# Patient Record
Sex: Male | Born: 1935 | Race: White | Hispanic: No | Marital: Married | Smoking: Never smoker
Health system: Southern US, Community
[De-identification: ages and names within clinical notes are randomized; demographics above are authoritative.]

## PROBLEM LIST (undated history)

## (undated) DIAGNOSIS — Z85820 Personal history of malignant melanoma of skin: Secondary | ICD-10-CM

## (undated) DIAGNOSIS — N400 Enlarged prostate without lower urinary tract symptoms: Secondary | ICD-10-CM

## (undated) DIAGNOSIS — C679 Malignant neoplasm of bladder, unspecified: Secondary | ICD-10-CM

## (undated) DIAGNOSIS — D509 Iron deficiency anemia, unspecified: Secondary | ICD-10-CM

## (undated) DIAGNOSIS — Z9889 Other specified postprocedural states: Secondary | ICD-10-CM

## (undated) DIAGNOSIS — Z8719 Personal history of other diseases of the digestive system: Secondary | ICD-10-CM

## (undated) DIAGNOSIS — I10 Essential (primary) hypertension: Secondary | ICD-10-CM

## (undated) DIAGNOSIS — E039 Hypothyroidism, unspecified: Secondary | ICD-10-CM

## (undated) HISTORY — PX: INGUINAL HERNIA REPAIR: SUR1180

## (undated) HISTORY — PX: MELANOMA EXCISION: SHX5266

## (undated) HISTORY — PX: ESOPHAGOGASTRODUODENOSCOPY ENDOSCOPY: SHX5814

---

## 1999-01-31 ENCOUNTER — Encounter: Payer: Self-pay | Admitting: Gastroenterology

## 1999-01-31 ENCOUNTER — Ambulatory Visit (HOSPITAL_COMMUNITY): Admission: RE | Admit: 1999-01-31 | Discharge: 1999-01-31 | Payer: Self-pay | Admitting: Pediatrics

## 2000-09-20 ENCOUNTER — Encounter: Payer: Self-pay | Admitting: General Surgery

## 2000-09-24 ENCOUNTER — Observation Stay (HOSPITAL_COMMUNITY): Admission: RE | Admit: 2000-09-24 | Discharge: 2000-09-25 | Payer: Self-pay | Admitting: General Surgery

## 2003-09-30 ENCOUNTER — Ambulatory Visit (HOSPITAL_COMMUNITY): Admission: RE | Admit: 2003-09-30 | Discharge: 2003-09-30 | Payer: Self-pay | Admitting: Gastroenterology

## 2004-02-25 ENCOUNTER — Encounter: Admission: RE | Admit: 2004-02-25 | Discharge: 2004-02-25 | Payer: Self-pay | Admitting: Gastroenterology

## 2012-11-21 ENCOUNTER — Other Ambulatory Visit: Payer: Self-pay | Admitting: Urology

## 2012-11-26 ENCOUNTER — Encounter (HOSPITAL_COMMUNITY): Payer: Self-pay | Admitting: Pharmacy Technician

## 2012-11-28 ENCOUNTER — Other Ambulatory Visit (HOSPITAL_COMMUNITY): Payer: Self-pay | Admitting: Urology

## 2012-11-28 NOTE — Patient Instructions (Addendum)
20 Matthew Walters  11/28/2012   Your procedure is scheduled on: 12/04/12  Report to [facility] at [phone].  Call this number if you have problems the morning of surgery [phone]   Remember:   Do not eat food:After Midnight Tuesday night, may have clear liquids from midnight Tuesday night until 755am day of surgery, then nothing by              mouth   Take these medicines the morning of surgery with A SIP OF WATER: synthroid, amlodipine/norvasc                                SEE [facility] PREPARING FOR SURGERY SHEET   Do not wear jewelry, make-up or nail polish.  Do not wear lotions, powders, or perfumes. You may wear deodorant.   Men may shave face and neck.  Do not bring valuables to the hospital.  Contacts, dentures or bridgework may not be worn into surgery.  Leave suitcase in the car. After surgery it may be brought to your room.  For patients admitted to the hospital, checkout time is 11:00 AM the day of discharge.    Please read over the following fact sheets that you were given: MRSA Information, clear liquids fact sheet  Call  Birdie Sons RN pre op nurse if needed [phone]    FAILURE TO FOLLOW THESE INSTRUCTIONS MAY RESULT IN THE CANCELLATION OF YOUR SURGERY. PATIENT SIGNATURE___________________________________________

## 2012-11-29 ENCOUNTER — Encounter (HOSPITAL_COMMUNITY)
Admission: RE | Admit: 2012-11-29 | Discharge: 2012-11-29 | Disposition: A | Discharge status: Home / Self Care | Payer: Medicare Other | Source: Ambulatory Visit | Attending: Urology | Admitting: Urology

## 2012-11-29 ENCOUNTER — Encounter (HOSPITAL_COMMUNITY): Payer: Self-pay

## 2012-11-29 ENCOUNTER — Ambulatory Visit (HOSPITAL_COMMUNITY)
Admission: RE | Admit: 2012-11-29 | Discharge: 2012-11-29 | Disposition: A | Discharge status: Home / Self Care | Payer: Medicare Other | Source: Ambulatory Visit | Attending: Urology | Admitting: Urology

## 2012-11-29 DIAGNOSIS — I10 Essential (primary) hypertension: Secondary | ICD-10-CM | POA: Insufficient documentation

## 2012-11-29 DIAGNOSIS — D494 Neoplasm of unspecified behavior of bladder: Secondary | ICD-10-CM | POA: Insufficient documentation

## 2012-11-29 DIAGNOSIS — K449 Diaphragmatic hernia without obstruction or gangrene: Secondary | ICD-10-CM | POA: Insufficient documentation

## 2012-11-29 DIAGNOSIS — M479 Spondylosis, unspecified: Secondary | ICD-10-CM | POA: Insufficient documentation

## 2012-11-29 DIAGNOSIS — Z01810 Encounter for preprocedural cardiovascular examination: Secondary | ICD-10-CM | POA: Insufficient documentation

## 2012-11-29 DIAGNOSIS — Z01818 Encounter for other preprocedural examination: Secondary | ICD-10-CM | POA: Insufficient documentation

## 2012-11-29 DIAGNOSIS — I517 Cardiomegaly: Secondary | ICD-10-CM | POA: Insufficient documentation

## 2012-11-29 DIAGNOSIS — Z79899 Other long term (current) drug therapy: Secondary | ICD-10-CM | POA: Insufficient documentation

## 2012-11-29 DIAGNOSIS — Z01812 Encounter for preprocedural laboratory examination: Secondary | ICD-10-CM | POA: Insufficient documentation

## 2012-11-29 HISTORY — DX: Benign prostatic hyperplasia without lower urinary tract symptoms: N40.0

## 2012-11-29 HISTORY — DX: Essential (primary) hypertension: I10

## 2012-11-29 HISTORY — DX: Hypothyroidism, unspecified: E03.9

## 2012-11-29 LAB — CBC
MCH: 29.7 pg (ref 26.0–34.0)
MCHC: 34.2 g/dL (ref 30.0–36.0)
Platelets: 212 10*3/uL (ref 150–400)
RBC: 4.71 MIL/uL (ref 4.22–5.81)
RDW: 12.7 % (ref 11.5–15.5)

## 2012-11-29 LAB — BASIC METABOLIC PANEL
BUN: 12 mg/dL (ref 6–23)
Calcium: 9.7 mg/dL (ref 8.4–10.5)
GFR calc non Af Amer: 80 mL/min — ABNORMAL LOW (ref >90)
Glucose, Bld: 94 mg/dL (ref 70–99)
Sodium: 140 mEq/L (ref 135–145)

## 2012-11-29 LAB — SURGICAL PCR SCREEN: MRSA, PCR: NEGATIVE

## 2012-12-03 MED ORDER — GENTAMICIN SULFATE 40 MG/ML IJ SOLN
320.0000 mg | INTRAVENOUS | Status: AC
  Administered 2012-12-04: 320 mg via INTRAVENOUS
  Filled 2012-12-03: qty 8

## 2012-12-04 ENCOUNTER — Ambulatory Visit (HOSPITAL_COMMUNITY): Payer: Medicare Other | Admitting: Anesthesiology

## 2012-12-04 ENCOUNTER — Encounter (HOSPITAL_COMMUNITY): Payer: Self-pay | Admitting: *Deleted

## 2012-12-04 ENCOUNTER — Ambulatory Visit (HOSPITAL_COMMUNITY)
Admission: RE | Admit: 2012-12-04 | Discharge: 2012-12-05 | Disposition: A | Discharge status: Home / Self Care | Payer: Medicare Other | Source: Ambulatory Visit | Attending: Urology | Admitting: Urology

## 2012-12-04 ENCOUNTER — Encounter (HOSPITAL_COMMUNITY)
Admission: RE | Disposition: A | Discharge status: Home / Self Care | Payer: Self-pay | Source: Ambulatory Visit | Attending: Urology

## 2012-12-04 ENCOUNTER — Encounter (HOSPITAL_COMMUNITY): Payer: Self-pay | Admitting: Anesthesiology

## 2012-12-04 DIAGNOSIS — N138 Other obstructive and reflux uropathy: Secondary | ICD-10-CM | POA: Insufficient documentation

## 2012-12-04 DIAGNOSIS — R39198 Other difficulties with micturition: Secondary | ICD-10-CM | POA: Insufficient documentation

## 2012-12-04 DIAGNOSIS — N401 Enlarged prostate with lower urinary tract symptoms: Secondary | ICD-10-CM | POA: Insufficient documentation

## 2012-12-04 DIAGNOSIS — I10 Essential (primary) hypertension: Secondary | ICD-10-CM | POA: Insufficient documentation

## 2012-12-04 DIAGNOSIS — N3289 Other specified disorders of bladder: Secondary | ICD-10-CM | POA: Insufficient documentation

## 2012-12-04 DIAGNOSIS — K219 Gastro-esophageal reflux disease without esophagitis: Secondary | ICD-10-CM | POA: Insufficient documentation

## 2012-12-04 DIAGNOSIS — C679 Malignant neoplasm of bladder, unspecified: Secondary | ICD-10-CM | POA: Insufficient documentation

## 2012-12-04 DIAGNOSIS — E039 Hypothyroidism, unspecified: Secondary | ICD-10-CM | POA: Insufficient documentation

## 2012-12-04 DIAGNOSIS — Z85820 Personal history of malignant melanoma of skin: Secondary | ICD-10-CM | POA: Insufficient documentation

## 2012-12-04 HISTORY — PX: CYSTOSCOPY W/ RETROGRADES: SHX1426

## 2012-12-04 HISTORY — PX: TRANSURETHRAL RESECTION OF BLADDER TUMOR: SHX2575

## 2012-12-04 HISTORY — PX: TRANSURETHRAL RESECTION OF PROSTATE: SHX73

## 2012-12-04 LAB — HEMOGLOBIN AND HEMATOCRIT, BLOOD: Hemoglobin: 12.6 g/dL — ABNORMAL LOW (ref 13.0–17.0)

## 2012-12-04 SURGERY — TURBT (TRANSURETHRAL RESECTION OF BLADDER TUMOR)
Anesthesia: General

## 2012-12-04 MED ORDER — SUCCINYLCHOLINE CHLORIDE 20 MG/ML IJ SOLN
INTRAMUSCULAR | Status: DC | PRN
  Administered 2012-12-04: 100 mg via INTRAVENOUS

## 2012-12-04 MED ORDER — AMLODIPINE BESYLATE 5 MG PO TABS
5.0000 mg | ORAL_TABLET | Freq: Every day | ORAL | Status: DC
  Administered 2012-12-05: 5 mg via ORAL
  Filled 2012-12-04 (×2): qty 1

## 2012-12-04 MED ORDER — HYDROMORPHONE HCL PF 1 MG/ML IJ SOLN
0.2500 mg | INTRAMUSCULAR | Status: DC | PRN
  Administered 2012-12-04: 0.25 mg via INTRAVENOUS

## 2012-12-04 MED ORDER — LACTATED RINGERS IV SOLN
INTRAVENOUS | Status: DC | PRN
  Administered 2012-12-04: 13:00:00 via INTRAVENOUS

## 2012-12-04 MED ORDER — EPHEDRINE SULFATE 50 MG/ML IJ SOLN
INTRAMUSCULAR | Status: DC | PRN
  Administered 2012-12-04 (×2): 10 mg via INTRAVENOUS

## 2012-12-04 MED ORDER — MIDAZOLAM HCL 5 MG/5ML IJ SOLN
INTRAMUSCULAR | Status: DC | PRN
  Administered 2012-12-04: 0.5 mg via INTRAVENOUS

## 2012-12-04 MED ORDER — IOHEXOL 300 MG/ML  SOLN
INTRAMUSCULAR | Status: DC | PRN
  Administered 2012-12-04: 13 mL via URETHRAL

## 2012-12-04 MED ORDER — SENNA 8.6 MG PO TABS
1.0000 | ORAL_TABLET | Freq: Two times a day (BID) | ORAL | Status: DC
Start: 2012-12-04 — End: 2012-12-05
  Administered 2012-12-04: 8.6 mg via ORAL
  Filled 2012-12-04: qty 1

## 2012-12-04 MED ORDER — KCL IN DEXTROSE-NACL 20-5-0.45 MEQ/L-%-% IV SOLN
INTRAVENOUS | Status: DC
  Administered 2012-12-04: via INTRAVENOUS
  Filled 2012-12-04 (×2): qty 1000

## 2012-12-04 MED ORDER — ACETAMINOPHEN 10 MG/ML IV SOLN
INTRAVENOUS | Status: AC
  Filled 2012-12-04: qty 100

## 2012-12-04 MED ORDER — FENTANYL CITRATE 0.05 MG/ML IJ SOLN
INTRAMUSCULAR | Status: DC | PRN
  Administered 2012-12-04 (×6): 25 ug via INTRAVENOUS
  Administered 2012-12-04: 50 ug via INTRAVENOUS
  Administered 2012-12-04: 25 ug via INTRAVENOUS

## 2012-12-04 MED ORDER — ONDANSETRON HCL 4 MG/2ML IJ SOLN
INTRAMUSCULAR | Status: DC | PRN
  Administered 2012-12-04 (×2): 2 mg via INTRAVENOUS

## 2012-12-04 MED ORDER — SODIUM CHLORIDE 0.9 % IR SOLN
Status: DC | PRN
  Administered 2012-12-04: 21000 mL

## 2012-12-04 MED ORDER — NEOSTIGMINE METHYLSULFATE 1 MG/ML IJ SOLN
INTRAMUSCULAR | Status: DC | PRN
  Administered 2012-12-04: 3 mg via INTRAVENOUS

## 2012-12-04 MED ORDER — FINASTERIDE 5 MG PO TABS
5.0000 mg | ORAL_TABLET | Freq: Every day | ORAL | Status: DC
  Filled 2012-12-04 (×2): qty 1

## 2012-12-04 MED ORDER — GLYCOPYRROLATE 0.2 MG/ML IJ SOLN
INTRAMUSCULAR | Status: DC | PRN
  Administered 2012-12-04: 0.4 mg via INTRAVENOUS
  Administered 2012-12-04: 0.2 mg via INTRAVENOUS

## 2012-12-04 MED ORDER — ONDANSETRON HCL 4 MG/2ML IJ SOLN: 4.0000 mg | INTRAMUSCULAR | Status: DC | PRN

## 2012-12-04 MED ORDER — LACTATED RINGERS IV SOLN
INTRAVENOUS | Status: DC
  Administered 2012-12-04: 1000 mL via INTRAVENOUS

## 2012-12-04 MED ORDER — OXYCODONE HCL 5 MG PO TABS: 5.0000 mg | ORAL_TABLET | ORAL | Status: DC | PRN

## 2012-12-04 MED ORDER — LIDOCAINE HCL (CARDIAC) 20 MG/ML IV SOLN
INTRAVENOUS | Status: DC | PRN
  Administered 2012-12-04: 30 mg via INTRAVENOUS

## 2012-12-04 MED ORDER — PROPOFOL 10 MG/ML IV BOLUS
INTRAVENOUS | Status: DC | PRN
  Administered 2012-12-04 (×2): 30 mg via INTRAVENOUS
  Administered 2012-12-04: 140 mg via INTRAVENOUS

## 2012-12-04 MED ORDER — CISATRACURIUM BESYLATE (PF) 10 MG/5ML IV SOLN
INTRAVENOUS | Status: DC | PRN
  Administered 2012-12-04: 2 mg via INTRAVENOUS
  Administered 2012-12-04 (×2): 1 mg via INTRAVENOUS
  Administered 2012-12-04: 5 mg via INTRAVENOUS
  Administered 2012-12-04: 1 mg via INTRAVENOUS

## 2012-12-04 MED ORDER — OXYBUTYNIN CHLORIDE 5 MG PO TABS
5.0000 mg | ORAL_TABLET | Freq: Three times a day (TID) | ORAL | Status: DC | PRN
  Administered 2012-12-04 – 2012-12-05 (×2): 5 mg via ORAL
  Filled 2012-12-04 (×2): qty 1

## 2012-12-04 MED ORDER — LACTATED RINGERS IV SOLN: INTRAVENOUS | Status: DC

## 2012-12-04 MED ORDER — PROMETHAZINE HCL 25 MG/ML IJ SOLN: 6.2500 mg | INTRAMUSCULAR | Status: DC | PRN

## 2012-12-04 MED ORDER — DOCUSATE SODIUM 100 MG PO CAPS
100.0000 mg | ORAL_CAPSULE | Freq: Two times a day (BID) | ORAL | Status: DC
  Administered 2012-12-04: 100 mg via ORAL
  Filled 2012-12-04 (×3): qty 1

## 2012-12-04 MED ORDER — IOHEXOL 300 MG/ML  SOLN
INTRAMUSCULAR | Status: AC
  Filled 2012-12-04: qty 1

## 2012-12-04 MED ORDER — HYDROMORPHONE HCL PF 1 MG/ML IJ SOLN
0.5000 mg | INTRAMUSCULAR | Status: DC | PRN
  Administered 2012-12-04: 1 mg via INTRAVENOUS
  Filled 2012-12-04: qty 1

## 2012-12-04 MED ORDER — ACETAMINOPHEN 10 MG/ML IV SOLN
1000.0000 mg | Freq: Four times a day (QID) | INTRAVENOUS | Status: DC
  Administered 2012-12-04 – 2012-12-05 (×3): 1000 mg via INTRAVENOUS
  Filled 2012-12-04 (×3): qty 100

## 2012-12-04 MED ORDER — LEVOTHYROXINE SODIUM 25 MCG PO TABS
25.0000 ug | ORAL_TABLET | Freq: Every day | ORAL | Status: DC
  Administered 2012-12-05: 25 ug via ORAL
  Filled 2012-12-04 (×2): qty 1

## 2012-12-04 MED ORDER — KCL IN DEXTROSE-NACL 20-5-0.45 MEQ/L-%-% IV SOLN
INTRAVENOUS | Status: AC
  Filled 2012-12-04: qty 1000

## 2012-12-04 MED ORDER — HYDROMORPHONE HCL PF 1 MG/ML IJ SOLN
INTRAMUSCULAR | Status: AC
  Filled 2012-12-04: qty 1

## 2012-12-04 MED ORDER — SODIUM CHLORIDE 0.9 % IR SOLN
Status: DC | PRN
  Administered 2012-12-04: 1000 mL

## 2012-12-04 SURGICAL SUPPLY — 35 items
ADAPTER CATH URET PLST 4-6FR (CATHETERS) ×2 IMPLANT
ADPR CATH URET STRL DISP 4-6FR (CATHETERS)
BAG URINE DRAINAGE (UROLOGICAL SUPPLIES) ×3 IMPLANT
BAG URO CATCHER STRL LF (DRAPE) ×3 IMPLANT
CATH FOLEY 3WAY 30CC 22FR (CATHETERS) ×3 IMPLANT
CATH INTERMIT  6FR 70CM (CATHETERS) ×1 IMPLANT
CATH URET 5FR 28IN CONE TIP (BALLOONS)
CATH URET 5FR 70CM CONE TIP (BALLOONS) IMPLANT
CLOTH BEACON ORANGE TIMEOUT ST (SAFETY) ×3 IMPLANT
DRAPE CAMERA CLOSED 9X96 (DRAPES) ×3 IMPLANT
ELECT BUTTON HF 24-28F 2 30DE (ELECTRODE) ×2 IMPLANT
ELECT LOOP MED HF 24F 12D (CUTTING LOOP) ×2 IMPLANT
ELECT LOOP MED HF 24F 12D CBL (CLIP) ×3 IMPLANT
ELECT REM PT RETURN 9FT ADLT (ELECTROSURGICAL)
ELECT RESECT VAPORIZE 12D CBL (ELECTRODE) ×2 IMPLANT
ELECTRODE REM PT RTRN 9FT ADLT (ELECTROSURGICAL) ×2 IMPLANT
EVACUATOR MICROVAS BLADDER (UROLOGICAL SUPPLIES) IMPLANT
GLOVE BIOGEL M STRL SZ7.5 (GLOVE) ×8 IMPLANT
GOWN STRL NON-REIN LRG LVL3 (GOWN DISPOSABLE) ×7 IMPLANT
GOWN STRL REIN XL XLG (GOWN DISPOSABLE) ×3 IMPLANT
GUIDEWIRE STR DUAL SENSOR (WIRE) ×3 IMPLANT
HOLDER FOLEY CATH W/STRAP (MISCELLANEOUS) IMPLANT
KIT ASPIRATION TUBING (SET/KITS/TRAYS/PACK) IMPLANT
LASER FIBER DISP (UROLOGICAL SUPPLIES) IMPLANT
LOOPS RESECTOSCOPE DISP (ELECTROSURGICAL) ×2 IMPLANT
MANIFOLD NEPTUNE II (INSTRUMENTS) ×3 IMPLANT
NS IRRIG 1000ML POUR BTL (IV SOLUTION) ×1 IMPLANT
PACK CYSTO (CUSTOM PROCEDURE TRAY) ×3 IMPLANT
PLUG CATH AND CAP STER (CATHETERS) ×2 IMPLANT
STENT CONTOUR 6FRX26X.038 (STENTS) ×1 IMPLANT
SUT ETHILON 3 0 PS 1 (SUTURE) IMPLANT
SYR 30ML LL (SYRINGE) IMPLANT
SYRINGE IRR TOOMEY STRL 70CC (SYRINGE) ×3 IMPLANT
TUBING CONNECTING 10 (TUBING) ×3 IMPLANT
WIRE COONS/BENSON .038X145CM (WIRE) ×2 IMPLANT

## 2012-12-04 NOTE — Brief Op Note (Signed)
12/04/2012  3:56 PM  PATIENT:  Matthew Walters  79 y.o. male  PRE-OPERATIVE DIAGNOSIS:  BLADDER CANCER, BENIGN PROSTATIC HYPERTROPHY  POST-OPERATIVE DIAGNOSIS:  BLADDER CANCER, BENIGN PROSTATIC HYPERTROPHY  PROCEDURE:  Procedure(s): TRANSURETHRAL RESECTION OF BLADDER TUMOR (TURBT) (N/A) TRANSURETHRAL RESECTION OF THE PROSTATE WITH GYRUS INSTRUMENTS (N/A) CYSTOSCOPY WITH RETROGRADE PYELOGRAM, right stent (Bilateral)  SURGEON:  Surgeon(s) and Role:    * Sebastian Ache, MD - Primary  PHYSICIAN ASSISTANT:   ASSISTANTS: none   ANESTHESIA:   none  EBL:  Total I/O In: -  Out: 200 [Blood:200]  BLOOD ADMINISTERED:none  DRAINS: 86F 3-way catheter to NS irrigation   LOCAL MEDICATIONS USED:  NONE  SPECIMEN:  Source of Specimen:  1 - bladder tumor, 2 - base of bladder tumor, 3 - prostate chips   DISPOSITION OF SPECIMEN:  PATHOLOGY  COUNTS:  YES  TOURNIQUET:  * No tourniquets in log *  DICTATION: .Other Dictation: Dictation Number [id number]  PLAN OF CARE: Admit for overnight observation  PATIENT DISPOSITION:  PACU - hemodynamically stable.   Delay start of Pharmacological VTE agent (>24hrs) due to surgical blood loss or risk of bleeding: yes

## 2012-12-04 NOTE — Anesthesia Preprocedure Evaluation (Addendum)
Anesthesia Evaluation  Patient identified by MRN, date of birth, ID band Patient awake    Reviewed: Allergy & Precautions, H&P , NPO status , Patient's Chart, lab work & pertinent test results  Airway Mallampati: II TM Distance: >3 FB Neck ROM: Full    Dental  (+) Poor Dentition, Partial Lower and Caps   Pulmonary neg pulmonary ROS,  breath sounds clear to auscultation  Pulmonary exam normal       Cardiovascular hypertension, Pt. on medications Rhythm:Regular Rate:Normal     Neuro/Psych negative neurological ROS  negative psych ROS   GI/Hepatic Neg liver ROS, GERD-  Medicated,  Endo/Other  Hypothyroidism   Renal/GU negative Renal ROS  negative genitourinary   Musculoskeletal negative musculoskeletal ROS (+)   Abdominal   Peds  Hematology negative hematology ROS (+)   Anesthesia Other Findings   Reproductive/Obstetrics                          Anesthesia Physical Anesthesia Plan  ASA: II  Anesthesia Plan: General   Post-op Pain Management:    Induction: Intravenous  Airway Management Planned: LMA  Additional Equipment:   Intra-op Plan:   Post-operative Plan: Extubation in OR  Informed Consent: I have reviewed the patients History and Physical, chart, labs and discussed the procedure including the risks, benefits and alternatives for the proposed anesthesia with the patient or authorized representative who has indicated his/her understanding and acceptance.   Dental advisory given  Plan Discussed with: CRNA  Anesthesia Plan Comments:         Anesthesia Quick Evaluation

## 2012-12-04 NOTE — Anesthesia Postprocedure Evaluation (Signed)
Anesthesia Post Note  Patient: Matthew Walters  Procedure(s) Performed: Procedure(s) (LRB): TRANSURETHRAL RESECTION OF BLADDER TUMOR (TURBT) (N/A) TRANSURETHRAL RESECTION OF THE PROSTATE WITH GYRUS INSTRUMENTS (N/A) CYSTOSCOPY WITH RETROGRADE PYELOGRAM, right stent (Bilateral)  Anesthesia type: General  Patient location: PACU  Post pain: Pain level controlled  Post assessment: Post-op Vital signs reviewed  Last Vitals:  Filed Vitals:   12/04/12 1738  BP: 132/72  Pulse: 62  Temp: 36.1 C  Resp: 14    Post vital signs: Reviewed  Level of consciousness: sedated  Complications: No apparent anesthesia complications

## 2012-12-04 NOTE — H&P (Signed)
Matthew Walters is an 79 y.o. male.    Chief Complaint: Pre-OP Transurethral Resection of Bladder Tumor and Prostate  HPI:   1 - Lower Urinary Tract Symptoms / Incomplete Emptying- Pt with progressive weak stream, double voiding x several years. No prior frank retention. No h/o Gu trauma or neurologic lesion.  PSA 2013 3 per report. No prior therapy. PVR 05/2012 " ". DRE with 50gm prostate and started on flomax + finasteride.  Pt then with improvemnt in symptoms with much less irritative and obstructive bother. Recent cysto however confirms trilobar hypertrophy with HUGE median lobe which makes access to his area of bladder tumor problematic.  2 - Bladder Cancer - Pt with gross blood and passage of some small clots 10/2012 promptig dedicated hematutria eval. CT Urogram 11/2012 with Rt sided polyploid mass near trigone and trilobar prostatic hypertrophy. Cysto 11/2012  confirms golf-ball sized mass near Rt UO and very large prostatic median lobe.  PMH sig for HTN, bilateral hernia repair.  Today [place] is seen ito proceed with TURBT and channel TURP. Most recent UA without infectious parameters.  Past Medical History  Diagnosis Date  . Hypertension   . Hypothyroidism   . GERD (gastroesophageal reflux disease)     hx of  . Anemia   . BPH (benign prostatic hypertrophy)   . Cancer     melanoma, bladder    Past Surgical History  Procedure Laterality Date  . Esophagogastroduodenoscopy endoscopy      "throat stretched"  . Hernia repair      x2  . Melanoma removal      x2    No family history on file. Social History:  reports that he has never smoked. He has never used smokeless tobacco. He reports that he does not drink alcohol or use illicit drugs.  Allergies:  Allergies  Allergen Reactions  . Sulfa Antibiotics Other (See Comments)    unknown    No prescriptions prior to admission    No results found for this or any previous visit (from the past 48 hour(s)). No results  found.  Review of Systems  Constitutional: Negative for fever, chills, weight loss and malaise/fatigue.  HENT: Negative.   Eyes: Negative.   Respiratory: Negative.   Cardiovascular: Negative.   Gastrointestinal: Negative.   Genitourinary: Positive for urgency, frequency and hematuria.  Musculoskeletal: Negative.   Skin: Negative.   Neurological: Negative.  Negative for weakness.  Endo/Heme/Allergies: Negative.   Psychiatric/Behavioral: Negative.     There were no vitals taken for this visit. Physical Exam  Constitutional: He is oriented to person, place, and time. He appears well-developed and well-nourished.  Very vigorous for age  HENT:  Head: Normocephalic and atraumatic.  Eyes: EOM are normal. Pupils are equal, round, and reactive to light.  Neck: Normal range of motion. Neck supple.  Cardiovascular: Normal rate and regular rhythm.   Respiratory: Effort normal and breath sounds normal.  GI: Soft. Bowel sounds are normal.  Genitourinary: Penis normal.  Musculoskeletal: Normal range of motion.  Neurological: He is alert and oriented to person, place, and time.  Skin: Skin is warm and dry.  Psychiatric: He has a normal mood and affect. His behavior is normal. Judgment and thought content normal.     Assessment/Plan  1 - Lower Urinary Tract Symptoms - Excellent subjective response to alpha blocker + 5ARI. Continue. Expalined that recent  PVR still elevated somewhat, but not dangerous level. Also explained that he will likely require "channel TURP" with resetion of  median lobe as a part of TURBT to allow better access to bladder tumor, and that this may help his LUTS somewhat.  2 - Bladder Cancer -  We re-discussed operative biopsy / transurethral resection as the best next step for diagnostic and therapeutic purposes with goals being to remove all visible cancer and obtain tissue for pathologic exam. We re-discussed that for some low-grade tumors, this may be all the treatment  required, but that for many other tumors such as high-grade lesions, further therapy including surgery and or chemotherapy may be warranted. We also outlined the fact that any bladder cancer diagnosis will require close follow-up with periodic upper and lower tract evaluation. We re-discussed risks including bleeding, infection, damage to kidney / ureter / bladder including bladder perforation which can typically managed with prolonged foley catheterization. We mentioned anesthetic and other rare risks including DVT, PE, MI, and mortality. I also mentioned that adjunctive procedures such as ureteral stenting, retrograde pyelography, and ureteroscopy may be necessary to fully evaluate the urinary tract depending on intra-operative findings. After answering all questions to the patient's satisfaction, they wish to proceed. Also re-dicussed concomitant channel TURP will be performed if unable to resect tumor adequeatly without, If TURP done, he will spend night in hospital for observation and require trial of void 3-5 days later.  The pt voiced understanding and wishes to proceed.  Temiloluwa Recchia 12/04/2012, 7:21 AM

## 2012-12-04 NOTE — Transfer of Care (Signed)
Immediate Anesthesia Transfer of Care Note  Patient: Matthew Walters  Procedure(s) Performed: Procedure(s): TRANSURETHRAL RESECTION OF BLADDER TUMOR (TURBT) (N/A) TRANSURETHRAL RESECTION OF THE PROSTATE WITH GYRUS INSTRUMENTS (N/A) CYSTOSCOPY WITH RETROGRADE PYELOGRAM, right stent (Bilateral)  Patient Location: PACU  Anesthesia Type:General  Level of Consciousness: sedated  Airway & Oxygen Therapy: Patient Spontanous Breathing and Patient connected to face mask oxygen  Post-op Assessment: Report given to PACU RN and Post -op Vital signs reviewed and stable  Post vital signs: stable  Complications: No apparent anesthesia complications

## 2012-12-05 ENCOUNTER — Encounter (HOSPITAL_COMMUNITY): Payer: Self-pay | Admitting: Urology

## 2012-12-05 MED ORDER — SENNA-DOCUSATE SODIUM 8.6-50 MG PO TABS: 1.0000 | ORAL_TABLET | Freq: Two times a day (BID) | ORAL | Status: DC

## 2012-12-05 MED ORDER — OXYCODONE-ACETAMINOPHEN 5-325 MG PO TABS: 1.0000 | ORAL_TABLET | ORAL | Status: DC | PRN

## 2012-12-05 MED ORDER — CEPHALEXIN 250 MG PO CAPS: 250.0000 mg | ORAL_CAPSULE | Freq: Two times a day (BID) | ORAL | Status: DC

## 2012-12-05 NOTE — Op Note (Signed)
Matthew Walters, Matthew Walters                 ACCOUNT NO.:  [id number]  MEDICAL RECORD NO.:  [id number]  LOCATION:  1434                         FACILITY:  [facility]  PHYSICIAN:  Sebastian Ache, MD     DATE OF BIRTH:  October 15, 1928  DATE OF PROCEDURE: 12/04/2012 DATE OF DISCHARGE:                              OPERATIVE REPORT   DIAGNOSES: 1. Prostatic hypertrophy. 2. Bladder cancer.  PROCEDURE: 1. Transurethral resection of bladder tumor volume large. 2. Transurethral resection of the prostate. 3. Bilateral retrograde pyelogram,  With interpretation. 4. Insertion of right ureteral stent 6 x 26, no tether.  ESTIMATED BLOOD LOSS:  50 mL.  SPECIMEN: 1. Bladder tumor. 2. Base of bladder tumor. 3. Prostate chips.  COMPLICATIONS:  None.  FINDINGS: 1. Very large prostatic median lobe.  Mild lateral lobe hypertrophy. 2. Large approximately 7 cm bladder tumor arising just lateral to the     right ureteral orifice.  INDICATION:  Matthew Walters is a pleasant 79 year old gentleman with history of obstructive voiding symptoms as well as recent gross hematuria.  This was a medical therapy for his voiding symptoms and had a good response, however, further workup of his hematuria revealed a very large bladder tumor just lateral to his right ureteral orifice.  Office cystoscopy which corroborated the finding and also a very large median lobe of his prostate.  It is clearly felt transurethral bladder tumor was warranted but due to the patient's very large median lobe, transurethral resection of the prostate would also be required concomitantly to allow access to the area of tumor and help with his voiding symptoms. Informed consent was obtained and placed in medical record.  PROCEDURE IN DETAIL:  The patient being Matthew Walters, was verified. Procedure being transurethral resection of bladder tumor, transurethral resection of prostate was confirmed.  Procedure was carried out.  Time- out was performed.   Intravenous antibiotics administered.  General endotracheal anesthesia was introduced.  The patient was placed into a low lithotomy position.  Sterile field was created by prepping the patient's penis, perineum, and proximal thighs using iodine x3.  Next, cystourethroscopy was performed using 22-French rigid cystoscope with 12- degree offset lens.  Inspection of anterior and posterior urethra revealed mild bilobar prostatic hypertrophy and very large median lobe hypertrophy.  Inspection of bladder revealed mild trabeculation.  There was a very large papillary bladder tumor arising from just lateral to the right ureteral orifice.  Attention was directed to the retrograde pyelograms first on the left side.  The left ureteral orifice was cannulated with a 6-French end-hole catheter and left retrograde pyelogram was seen.  Left retrograde pyelogram demonstrated a single left ureter, single system left kidney.  No filling defects or narrowing were noted. Similarly right retrograde pyelogram was obtained.  Right retrograde pyelogram demonstrated a single right ureter, single system right kidney. No filling defects or narrowing noted.  There is tortuosity demonstrated and it was felt that given the location of the tumor next to the ureteral orifice that stenting would be warranted postoperatively; however, decision was made not to do this at this time. It was felt that a stent would be in the way of resection of the tumor.  Also, there is significant amount of torque placed on the friable prostate to allow access to the bladder tumor and decision was made to proceed with channel resection of this.  The cystoscope was exchanged for the gyrus continuous flow resectoscope visual obturator.  This was exchanged for a medium loop and transurethral resection of the prostate was performed, first the large median lobe carrying it down to what appeared to be the floor of the bladder from the area of the  bladder neck towards the area of the verumontanum, taking great care not to resect distal to this.  The lateral lobes were also carefully resected from the base towards the area of the apex and area of verumontanum.  Prostate chips were irrigated and set aside for permanent pathology.  Additional hemostasis was achieved with coagulation current.  No perforation of the bladder or prostatic capsule was noted following these maneuvers.  This allowed much better access to the area of bladder tumor.  Next,  careful transurethral resection of the bladder tumor was performed, a top down approach taking excquisite care to avoid bladder perforation which did not occur.  This was performed down to the level of the bladder mucosa and bladder tumor chips were irrigated and set aside at the level of bladder tumor.  Separate specimens of the base which included apparent fibromuscular layer were obtained using cold cup biopsy forceps and set aside for permanent pathology labeled at base of bladder tumor. Searching following these maneuvers revealed no evidence of bladder perforation.  As the area of resection had been very close to the right ureteral orifice, again it was felt that right ureteral stenting was warranted to adequate healing of the distal ureter.  Again, the 6-French End-hole catheter was advanced to the level of the right ureteral orifice and right retrograde pyelogram again demonstrated a single right ureter.  There was no extravasation noted.  A Sensor wire was advanced at the level of the upper pole over which a new 6 x 26 double-J stent was placed using cystoscopic and fluoroscopic guidance.  Good proximal and distal curl were noted.  Hemostasis appeared excellent.  The cystoscope was exchanged for a new 22-French 3-Foley catheter.  A 20 mL sterile water in the balloon.  This connected to normal saline irrigation with the efflux being light pink.  The procedure was then terminated.   The patient tolerated the procedure well.  There were no immediate periprocedural complications.  The patient was taken to postanesthesia care unit in stable condition.          ______________________________ Sebastian Ache, MD     TM/MEDQ  D:  12/04/2012  T:  12/05/2012  Job:  [id number]

## 2012-12-05 NOTE — Discharge Summary (Signed)
Physician Discharge Summary  Patient ID: Matthew Walters MRN: [id number] DOB/AGE: 1928/12/05 79 y.o.  Admit date: 12/04/2012 Discharge date: 12/05/2012  Admission Diagnoses: Bladder Cancer, Prostatic Hypertrophy  Discharge Diagnoses: Bladder Cancer, Prostatic Hypertrophy  Discharged Condition: good  Hospital Course: Pt underwent transurethral resection of large bladder tumor + transurethral resection of prostate + rt ureteral stent placement on 5/21, the day of admission, without acute complications. He was observed overnight in the 4th floor Urology service. He was initially managed on continuous bladder irrigation with NS that was weaned to off as the efflux cleared progressively. By POD 1 he was ambulatory, tolerating diet, Hgb acceptable, and felt to be adequate for discharge. He will leave with foley in place with plan to remove in urology office tomorrow.   Consults: None  Significant Diagnostic Studies: labs: Hgb >11  Treatments: surgery:  transurethral resection of large bladder tumor + transurethral resection of prostate + rt ureteral stent placement on 5/21,  Discharge Exam: Blood pressure 143/69, pulse 68, temperature 97.5 F (36.4 C), temperature source Oral, resp. rate 15, height 5\' 5"  (1.651 m), weight 71.215 kg (157 lb), SpO2 99.00%. General appearance: alert, cooperative, appears stated age and family at bedside Head: Normocephalic, without obvious abnormality, atraumatic Eyes: conjunctivae/corneas clear. PERRL, EOM's intact. Fundi benign. Ears: normal TM's and external ear canals both ears Nose: Nares normal. Septum midline. Mucosa normal. No drainage or sinus tenderness. Throat: lips, mucosa, and tongue normal; teeth and gums normal Neck: no adenopathy, no carotid bruit, no JVD, supple, symmetrical, trachea midline and thyroid not enlarged, symmetric, no tenderness/mass/nodules Back: symmetric, no curvature. ROM normal. No CVA tenderness. Resp: clear to auscultation  bilaterally Chest wall: no tenderness Cardio: regular rate and rhythm, S1, S2 normal, no murmur, click, rub or gallop GI: soft, non-tender; bowel sounds normal; no masses,  no organomegaly Male genitalia: normal, Foley c/d/i with light pin kurine off irrigation. Extremities: extremities normal, atraumatic, no cyanosis or edema Pulses: 2+ and symmetric Skin: Skin color, texture, turgor normal. No rashes or lesions Lymph nodes: Cervical, supraclavicular, and axillary nodes normal. Neurologic: Grossly normal  Disposition:      Medication List    TAKE these medications       amLODipine 5 MG tablet  Commonly known as:  NORVASC  Take 5 mg by mouth daily before breakfast.     cephALEXin 250 MG capsule  Commonly known as:  KEFLEX  Take 1 capsule (250 mg total) by mouth 2 (two) times daily. X 3 days to prevent infection.     cholecalciferol 1000 UNITS tablet  Commonly known as:  VITAMIN D  Take 1,000 Units by mouth daily.     ferrous fumarate 325 (106 FE) MG Tabs  Commonly known as:  HEMOCYTE - 106 mg FE  Take 1 tablet by mouth daily.     finasteride 5 MG tablet  Commonly known as:  PROSCAR  Take 5 mg by mouth daily.     levothyroxine 25 MCG tablet  Commonly known as:  SYNTHROID, LEVOTHROID  Take 25 mcg by mouth daily before breakfast.     oxyCODONE-acetaminophen 5-325 MG per tablet  Commonly known as:  ROXICET  Take 1 tablet by mouth every 4 (four) hours as needed for pain.     sennosides-docusate sodium 8.6-50 MG tablet  Commonly known as:  SENOKOT-S  Take 1 tablet by mouth 2 (two) times daily. While taking pain meds to prevent constipation           Follow-up Information  Follow up with Sebastian Ache, MD On 12/17/2012. (10:30 AM for MD visit and stent removal. We will also see you tomorrow for nurse visit / catheter removal.)    Contact information:   [address] [place] [address] [phone]       Signed: Sebastian Ache 12/05/2012, 7:44  AM

## 2012-12-19 ENCOUNTER — Other Ambulatory Visit: Payer: Self-pay | Admitting: Urology

## 2013-01-23 ENCOUNTER — Encounter (HOSPITAL_BASED_OUTPATIENT_CLINIC_OR_DEPARTMENT_OTHER): Payer: Self-pay | Admitting: *Deleted

## 2013-01-24 ENCOUNTER — Encounter (HOSPITAL_BASED_OUTPATIENT_CLINIC_OR_DEPARTMENT_OTHER): Payer: Self-pay | Admitting: *Deleted

## 2013-01-24 NOTE — Progress Notes (Signed)
SPOKE W/ PT WIFE. NPO AFTER MN. ARRIVES AT 0700. NEEDS ISTAT. CURRENT EKG IN EPIC AND CHART. WILL TAKE NORVASC AND SYNTHROID.

## 2013-01-29 ENCOUNTER — Encounter (HOSPITAL_BASED_OUTPATIENT_CLINIC_OR_DEPARTMENT_OTHER)
Admission: RE | Disposition: A | Discharge status: Home / Self Care | Payer: Self-pay | Source: Ambulatory Visit | Attending: Urology

## 2013-01-29 ENCOUNTER — Encounter (HOSPITAL_BASED_OUTPATIENT_CLINIC_OR_DEPARTMENT_OTHER): Payer: Self-pay

## 2013-01-29 ENCOUNTER — Ambulatory Visit (HOSPITAL_BASED_OUTPATIENT_CLINIC_OR_DEPARTMENT_OTHER): Payer: Medicare Other | Admitting: Anesthesiology

## 2013-01-29 ENCOUNTER — Encounter (HOSPITAL_BASED_OUTPATIENT_CLINIC_OR_DEPARTMENT_OTHER): Payer: Self-pay | Admitting: Anesthesiology

## 2013-01-29 ENCOUNTER — Ambulatory Visit (HOSPITAL_COMMUNITY): Payer: Medicare Other

## 2013-01-29 ENCOUNTER — Ambulatory Visit (HOSPITAL_BASED_OUTPATIENT_CLINIC_OR_DEPARTMENT_OTHER)
Admission: RE | Admit: 2013-01-29 | Discharge: 2013-01-29 | Disposition: A | Discharge status: Home / Self Care | Payer: Medicare Other | Source: Ambulatory Visit | Attending: Urology | Admitting: Urology

## 2013-01-29 DIAGNOSIS — C679 Malignant neoplasm of bladder, unspecified: Secondary | ICD-10-CM | POA: Insufficient documentation

## 2013-01-29 DIAGNOSIS — N138 Other obstructive and reflux uropathy: Secondary | ICD-10-CM | POA: Insufficient documentation

## 2013-01-29 DIAGNOSIS — Z85820 Personal history of malignant melanoma of skin: Secondary | ICD-10-CM | POA: Insufficient documentation

## 2013-01-29 DIAGNOSIS — E039 Hypothyroidism, unspecified: Secondary | ICD-10-CM | POA: Insufficient documentation

## 2013-01-29 DIAGNOSIS — I10 Essential (primary) hypertension: Secondary | ICD-10-CM | POA: Insufficient documentation

## 2013-01-29 DIAGNOSIS — N401 Enlarged prostate with lower urinary tract symptoms: Secondary | ICD-10-CM | POA: Insufficient documentation

## 2013-01-29 DIAGNOSIS — K219 Gastro-esophageal reflux disease without esophagitis: Secondary | ICD-10-CM | POA: Insufficient documentation

## 2013-01-29 DIAGNOSIS — Z87442 Personal history of urinary calculi: Secondary | ICD-10-CM | POA: Insufficient documentation

## 2013-01-29 HISTORY — DX: Iron deficiency anemia, unspecified: D50.9

## 2013-01-29 HISTORY — PX: CYSTOSCOPY W/ RETROGRADES: SHX1426

## 2013-01-29 HISTORY — DX: Other specified postprocedural states: Z98.890

## 2013-01-29 HISTORY — DX: Personal history of other diseases of the digestive system: Z87.19

## 2013-01-29 HISTORY — DX: Other specified postprocedural states: Z85.820

## 2013-01-29 HISTORY — PX: TRANSURETHRAL RESECTION OF BLADDER TUMOR: SHX2575

## 2013-01-29 HISTORY — DX: Malignant neoplasm of bladder, unspecified: C67.9

## 2013-01-29 LAB — POCT I-STAT, CHEM 8
Chloride: 106 mEq/L (ref 96–112)
Creatinine, Ser: 0.8 mg/dL (ref 0.50–1.35)
HCT: 40 % (ref 39.0–52.0)
Hemoglobin: 13.6 g/dL (ref 13.0–17.0)
Potassium: 3.6 mEq/L (ref 3.5–5.1)
Sodium: 140 mEq/L (ref 135–145)

## 2013-01-29 SURGERY — TURBT (TRANSURETHRAL RESECTION OF BLADDER TUMOR)
Anesthesia: General | Site: Ureter | Wound class: Clean Contaminated

## 2013-01-29 MED ORDER — FENTANYL CITRATE 0.05 MG/ML IJ SOLN
INTRAMUSCULAR | Status: DC | PRN
  Administered 2013-01-29 (×2): 50 ug via INTRAVENOUS

## 2013-01-29 MED ORDER — DEXAMETHASONE SODIUM PHOSPHATE 4 MG/ML IJ SOLN
INTRAMUSCULAR | Status: DC | PRN
  Administered 2013-01-29: 10 mg via INTRAVENOUS

## 2013-01-29 MED ORDER — GENTAMICIN IN SALINE 1.6-0.9 MG/ML-% IV SOLN
80.0000 mg | INTRAVENOUS | Status: AC
  Administered 2013-01-29: 350 mg via INTRAVENOUS
  Filled 2013-01-29: qty 50

## 2013-01-29 MED ORDER — FENTANYL CITRATE 0.05 MG/ML IJ SOLN
25.0000 ug | INTRAMUSCULAR | Status: DC | PRN
  Filled 2013-01-29: qty 1

## 2013-01-29 MED ORDER — CEPHALEXIN 250 MG PO CAPS: 250.0000 mg | ORAL_CAPSULE | Freq: Two times a day (BID) | ORAL | Status: DC

## 2013-01-29 MED ORDER — LACTATED RINGERS IV SOLN
INTRAVENOUS | Status: DC
  Filled 2013-01-29: qty 1000

## 2013-01-29 MED ORDER — PROMETHAZINE HCL 25 MG/ML IJ SOLN
6.2500 mg | INTRAMUSCULAR | Status: DC | PRN
  Filled 2013-01-29: qty 1

## 2013-01-29 MED ORDER — ONDANSETRON HCL 4 MG/2ML IJ SOLN
INTRAMUSCULAR | Status: DC | PRN
  Administered 2013-01-29: 4 mg via INTRAVENOUS

## 2013-01-29 MED ORDER — SODIUM CHLORIDE 0.9 % IR SOLN
Status: DC | PRN
  Administered 2013-01-29: 6000 mL

## 2013-01-29 MED ORDER — OXYCODONE-ACETAMINOPHEN 5-325 MG PO TABS
1.0000 | ORAL_TABLET | ORAL | Status: AC | PRN
  Administered 2013-01-29: 1 via ORAL
  Filled 2013-01-29: qty 1

## 2013-01-29 MED ORDER — NEOSTIGMINE METHYLSULFATE 1 MG/ML IJ SOLN
INTRAMUSCULAR | Status: DC | PRN
  Administered 2013-01-29: 3.5 mg via INTRAVENOUS

## 2013-01-29 MED ORDER — PROPOFOL 10 MG/ML IV BOLUS
INTRAVENOUS | Status: DC | PRN
  Administered 2013-01-29: 160 mg via INTRAVENOUS

## 2013-01-29 MED ORDER — ROCURONIUM BROMIDE 100 MG/10ML IV SOLN
INTRAVENOUS | Status: DC | PRN
  Administered 2013-01-29: 30 mg via INTRAVENOUS

## 2013-01-29 MED ORDER — IOHEXOL 350 MG/ML SOLN
INTRAVENOUS | Status: DC | PRN
  Administered 2013-01-29: 11 mL

## 2013-01-29 MED ORDER — OXYCODONE-ACETAMINOPHEN 5-325 MG PO TABS: 1.0000 | ORAL_TABLET | ORAL | Status: DC | PRN

## 2013-01-29 MED ORDER — LACTATED RINGERS IV SOLN
INTRAVENOUS | Status: DC
  Administered 2013-01-29: 08:00:00 via INTRAVENOUS
  Filled 2013-01-29: qty 1000

## 2013-01-29 MED ORDER — GLYCOPYRROLATE 0.2 MG/ML IJ SOLN
INTRAMUSCULAR | Status: DC | PRN
  Administered 2013-01-29: .7 mg via INTRAVENOUS

## 2013-01-29 MED ORDER — LIDOCAINE HCL (CARDIAC) 20 MG/ML IV SOLN
INTRAVENOUS | Status: DC | PRN
  Administered 2013-01-29: 50 mg via INTRAVENOUS

## 2013-01-29 SURGICAL SUPPLY — 32 items
BAG DRN ANRFLXCHMBR STRAP LEK (BAG)
BAG URINE DRAINAGE (UROLOGICAL SUPPLIES) IMPLANT
BAG URINE LEG 19OZ MD ST LTX (BAG) IMPLANT
BAG URINE LEG 500ML (DRAIN) IMPLANT
BAG URO CATCHER STRL LF (DRAPE) ×3 IMPLANT
BASKET ZERO TIP NITINOL 2.4FR (BASKET) IMPLANT
BSKT STON RTRVL ZERO TP 2.4FR (BASKET)
CATH FOLEY 2WAY SLVR  5CC 22FR (CATHETERS)
CATH FOLEY 2WAY SLVR 30CC 20FR (CATHETERS) IMPLANT
CATH FOLEY 2WAY SLVR 5CC 22FR (CATHETERS) IMPLANT
CATH INTERMIT  6FR 70CM (CATHETERS) IMPLANT
CLOTH BEACON ORANGE TIMEOUT ST (SAFETY) ×3 IMPLANT
DRAPE CAMERA CLOSED 9X96 (DRAPES) ×3 IMPLANT
ELECT LOOP MED HF 24F 12D CBL (CLIP) ×1 IMPLANT
ELECT REM PT RETURN 9FT ADLT (ELECTROSURGICAL)
ELECTRODE REM PT RTRN 9FT ADLT (ELECTROSURGICAL) ×2 IMPLANT
EVACUATOR MICROVAS BLADDER (UROLOGICAL SUPPLIES) IMPLANT
GLOVE BIO SURGEON STRL SZ7 (GLOVE) ×4 IMPLANT
GLOVE BIO SURGEON STRL SZ7.5 (GLOVE) ×3 IMPLANT
GLOVE INDICATOR 7.5 STRL GRN (GLOVE) ×1 IMPLANT
GOWN PREVENTION PLUS XLARGE (GOWN DISPOSABLE) ×3 IMPLANT
GOWN STRL NON-REIN LRG LVL3 (GOWN DISPOSABLE) ×5 IMPLANT
GUIDEWIRE ANG ZIPWIRE 038X150 (WIRE) IMPLANT
GUIDEWIRE STR DUAL SENSOR (WIRE) ×3 IMPLANT
IV NS IRRIG 3000ML ARTHROMATIC (IV SOLUTION) ×4 IMPLANT
KIT ASPIRATION TUBING (SET/KITS/TRAYS/PACK) IMPLANT
LOOP CUTTING 24FR OLYMPUS (CUTTING LOOP) IMPLANT
LOOP CUTTING 26FR OLYMPUS (CUTTING LOOP) IMPLANT
PACK CYSTOSCOPY (CUSTOM PROCEDURE TRAY) ×3 IMPLANT
SET ASPIRATION TUBING (TUBING) IMPLANT
STENT URET 6FRX26 CONTOUR (STENTS) ×1 IMPLANT
SYRINGE IRR TOOMEY STRL 70CC (SYRINGE) IMPLANT

## 2013-01-29 NOTE — Anesthesia Postprocedure Evaluation (Signed)
Anesthesia Post Note  Patient: Matthew Walters  Procedure(s) Performed: Procedure(s) (LRB): TRANSURETHRAL RESECTION OF BLADDER TUMOR (TURBT) RESTAGING TURBT (N/A) CYSTOSCOPY WITH RETROGRADE PYELOGRAM, BILATERAL with insertion right stent (Bilateral)  Anesthesia type: General  Patient location: PACU  Post pain: Pain level controlled  Post assessment: Post-op Vital signs reviewed  Last Vitals:  Filed Vitals:   01/29/13 1015  BP: 119/64  Pulse: 59  Temp:   Resp: 11    Post vital signs: Reviewed  Level of consciousness: sedated  Complications: No apparent anesthesia complications

## 2013-01-29 NOTE — Transfer of Care (Signed)
Immediate Anesthesia Transfer of Care Note  Patient: Matthew Walters  Procedure(s) Performed: Procedure(s) with comments: TRANSURETHRAL RESECTION OF BLADDER TUMOR (TURBT) RESTAGING TURBT (N/A) - rad tech/c-arm CYSTOSCOPY WITH RETROGRADE PYELOGRAM, BILATERAL with insertion right stent (Bilateral)  Patient Location: PACU  Anesthesia Type:General  Level of Consciousness: sedated and responds to stimulation  Airway & Oxygen Therapy: Patient Spontanous Breathing and Patient connected to nasal cannula oxygen  Post-op Assessment: Report given to PACU RN  Post vital signs: Reviewed and stable  Complications: No apparent anesthesia complications

## 2013-01-29 NOTE — H&P (Signed)
Matthew Walters is an 79 y.o. male.    Chief Complaint: PRe-Op Restaging Transurethral Resection of Bladder Tumor  HPI:      1 - Benign Prostatic Hyperplasia /  Lower Urinary Tract Symptoms / Incomplete Emptying -  s/p TURP 12/05/12 at time of TURBT for progressive weak stream, double voiding x several years. No prior frank retention. No h/o Gu trauma or neurologic lesion.  PSA 2013 3 per report. No prior therapy. PVR 05/2012 " " pre TURP.  2 - Nephrolithiasis - Pt with remote history of single episode of stone passage in 1980s, none since. CT 2014 stone free.  3 - Bladder Cancer - 11/2012 - T1G3 with muscle in specimen tumor just lateral to Rt UO, JJ stent placed. CT Urogram no advanced disesae.  PMH sig for HTN, bilateral hernia repair.  Today Matthew Walters is seen to proceed with restaging TURBT. No interval hematuria or fevers. Most recent UCX negative.   Past Medical History  Diagnosis Date  . Hypertension   . Hypothyroidism   . BPH (benign prostatic hypertrophy)   . Bladder cancer   . History of melanoma excision   . Iron deficiency anemia   . History of gastroesophageal reflux (GERD)     PT DENIES  . H/O hiatal hernia     Past Surgical History  Procedure Laterality Date  . Esophagogastroduodenoscopy endoscopy      ESOPHAGEAL DILATION  . Transurethral resection of bladder tumor N/A 12/04/2012    Procedure: TRANSURETHRAL RESECTION OF BLADDER TUMOR (TURBT);  Surgeon: Sebastian Ache, MD;  Location: [facility];  Service: Urology;  Laterality: N/A;  . Transurethral resection of prostate N/A 12/04/2012    Procedure: TRANSURETHRAL RESECTION OF THE PROSTATE WITH GYRUS INSTRUMENTS;  Surgeon: Sebastian Ache, MD;  Location: [facility];  Service: Urology;  Laterality: N/A;  . Cystoscopy w/ retrogrades Bilateral 12/04/2012    Procedure: CYSTOSCOPY WITH RETROGRADE PYELOGRAM, right stent;  Surgeon: Sebastian Ache, MD;  Location: [facility];  Service: Urology;  Laterality: Bilateral;  . Melanoma excision      SHOULDER AND WRIST  . Inguinal hernia repair Bilateral     History reviewed. No pertinent family history. Social History:  reports that he has never smoked. He has never used smokeless tobacco. He reports that he does not drink alcohol or use illicit drugs.  Allergies:  Allergies  Allergen Reactions  . Sulfa Antibiotics Other (See Comments)    unknown    No prescriptions prior to admission    No results found for this or any previous visit (from the past 48 hour(s)). No results found.  Review of Systems  Constitutional: Negative.  Negative for fever and chills.  HENT: Negative.   Eyes: Negative.   Respiratory: Negative.   Cardiovascular: Negative.   Gastrointestinal: Negative.   Genitourinary: Negative.   Musculoskeletal: Negative.   Skin: Negative.   Neurological: Negative.   Endo/Heme/Allergies: Negative.   Psychiatric/Behavioral: Negative.     Height 5\' 5"  (1.651 m), weight 71.215 kg (157 lb). Physical Exam  Constitutional: He appears well-developed and well-nourished.  HENT:  Head: Normocephalic and atraumatic.  Eyes: Pupils are equal, round, and reactive to light.  Neck: Normal range of motion. Neck supple.  Cardiovascular: Normal rate and regular rhythm.   Respiratory: Effort normal.  GI: Soft.  Genitourinary: Penis normal.  Musculoskeletal: Normal range of motion.  Neurological: He is alert.  Skin: Skin is warm and dry.  Psychiatric: He has a normal mood and affect. His behavior is normal. Judgment and  thought content normal.     Assessment/Plan    1 - Lower Urinary Tract Symptoms - now s/p TURP, observe.  2 - Nephrolithiasis - No recent colic. Observe.  3 -  Bladder Cancer -  T1G3 path with optimal plan of re-TURBT 6 weeks to verify no T2 or sig residual / recurrent disease as well as subsequent BCG induction following re-TURBT. Pt wants to proceed. Risks reinforced as per previous. His 6 weeks has elapsed and he wants to proceed with TURBT today.  Risks again outlined including non-cure, bladder perforation, MI, CVA, PE, DVT, and mortality.   Huntley Knoop 01/29/2013, 4:43 AM

## 2013-01-29 NOTE — Anesthesia Preprocedure Evaluation (Signed)
Anesthesia Evaluation  Patient identified by MRN, date of birth, ID band Patient awake    Reviewed: Allergy & Precautions, H&P , NPO status , Patient's Chart, lab work & pertinent test results  Airway Mallampati: II TM Distance: >3 FB Neck ROM: Full    Dental  (+) Poor Dentition, Partial Lower and Caps   Pulmonary neg pulmonary ROS,  breath sounds clear to auscultation  Pulmonary exam normal       Cardiovascular hypertension, Pt. on medications Rhythm:Regular Rate:Normal     Neuro/Psych negative neurological ROS  negative psych ROS   GI/Hepatic Neg liver ROS, GERD-  Medicated,  Endo/Other  Hypothyroidism   Renal/GU negative Renal ROS  negative genitourinary   Musculoskeletal negative musculoskeletal ROS (+)   Abdominal   Peds  Hematology negative hematology ROS (+)   Anesthesia Other Findings   Reproductive/Obstetrics                           Anesthesia Physical Anesthesia Plan  ASA: II  Anesthesia Plan: General   Post-op Pain Management:    Induction: Intravenous  Airway Management Planned: Oral ETT  Additional Equipment:   Intra-op Plan:   Post-operative Plan: Extubation in OR  Informed Consent: I have reviewed the patients History and Physical, chart, labs and discussed the procedure including the risks, benefits and alternatives for the proposed anesthesia with the patient or authorized representative who has indicated his/her understanding and acceptance.   Dental advisory given  Plan Discussed with: CRNA  Anesthesia Plan Comments:         Anesthesia Quick Evaluation

## 2013-01-29 NOTE — Brief Op Note (Signed)
01/29/2013  9:08 AM  PATIENT:  Matthew Walters  79 y.o. male  PRE-OPERATIVE DIAGNOSIS:  BLADDER CANCER  POST-OPERATIVE DIAGNOSIS:  BLADDER CANCER  PROCEDURE:  Procedure(s) with comments: TRANSURETHRAL RESECTION OF BLADDER TUMOR (TURBT) RESTAGING TURBT (N/A) - rad tech/c-arm CYSTOSCOPY WITH RETROGRADE PYELOGRAM, BILATERAL with insertion right stent (Bilateral)  SURGEON:  Surgeon(s) and Role:    * Sebastian Ache, MD - Primary  PHYSICIAN ASSISTANT:   ASSISTANTS: none   ANESTHESIA:   general  EBL:  Total I/O In: 200 [I.V.:200] Out: -   BLOOD ADMINISTERED:none  DRAINS: none   LOCAL MEDICATIONS USED:  NONE  SPECIMEN:  Source of Specimen:  1 - Old Bladder Resection Site, 2 - Base of Old Resection Site  DISPOSITION OF SPECIMEN:  PATHOLOGY  COUNTS:  YES  TOURNIQUET:  * No tourniquets in log *  DICTATION: .Other Dictation: Dictation Number [id number]  PLAN OF CARE: Discharge to home after PACU  PATIENT DISPOSITION:  PACU - hemodynamically stable.   Delay start of Pharmacological VTE agent (>24hrs) due to surgical blood loss or risk of bleeding: no

## 2013-01-29 NOTE — Anesthesia Procedure Notes (Addendum)
Procedure Name: Intubation Date/Time: 01/29/2013 8:33 AM Performed by: Maris Berger T Pre-anesthesia Checklist: Patient identified, Emergency Drugs available, Suction available and Patient being monitored Patient Re-evaluated:Patient Re-evaluated prior to inductionOxygen Delivery Method: Circle System Utilized Preoxygenation: Pre-oxygenation with 100% oxygen Intubation Type: IV induction Ventilation: Mask ventilation without difficulty Laryngoscope Size: Mac and 3 Grade View: Grade I Tube type: Oral Tube size: 8.0 mm Number of attempts: 1 Airway Equipment and Method: stylet and oral airway Placement Confirmation: ETT inserted through vocal cords under direct vision,  positive ETCO2 and breath sounds checked- equal and bilateral Secured at: 22 cm Tube secured with: Tape Dental Injury: Teeth and Oropharynx as per pre-operative assessment

## 2013-01-30 ENCOUNTER — Encounter (HOSPITAL_BASED_OUTPATIENT_CLINIC_OR_DEPARTMENT_OTHER): Payer: Self-pay | Admitting: Urology

## 2013-01-30 NOTE — Op Note (Signed)
Matthew Walters, Matthew Walters                 ACCOUNT NO.:  [id number]  MEDICAL RECORD NO.:  [id number]  LOCATION:                                FACILITY:  [facility]  PHYSICIAN:  Sebastian Ache, MD     DATE OF BIRTH:  03/14/1929  DATE OF PROCEDURE:  01/29/2013 DATE OF DISCHARGE:  01/29/2013                              OPERATIVE REPORT   DIAGNOSIS:  History of high-grade bladder cancer.  PROCEDURE: 1. Restaging transurethral resection of bladder tumor, volume small. 2. Bilateral retrograde pyelograms interpretation. 3. Insertion of right ureteral stent 6 x 26 with tether to the dorsum     of the penis.  COMPLICATIONS:  None.  SPECIMEN: 1. Old resection site. 2. Base of old resection site.  FINDINGS: 1. Widely open urinary channel. 2. Prostatic fossa consistent with prior TURP. 3. No evidence of recurrence of bladder tumor at the old resection     site just lateral to the right ureteral orifice. 4. Unremarkable bilateral retrograde pyelograms, post re-resection.  ESTIMATED BLOOD LOSS:  Nil.  INDICATION:  Matthew Walters is an very pleasant and vigorous 79 year old gentleman with history of a T1, G3 bladder cancer, status post resection approximately 6 weeks ago.  He has done well in the interval.  He now presents for restaging resection given that his prior tumor had been fairly close to his right ureteral orifice.  We also discussed the perioperative stenting which he had last time as well.  He understands this is a possibility.  Informed consent was obtained and placed in medical record.  PROCEDURE IN DETAIL:  The patient being Matthew Walters, was verified. Procedure being restaging, transurethral resection of bladder tumor was confirmed.  Procedure was carried out.  Time-out was performed. Intravenous antibiotics were administered.  General endotracheal anesthesia was introduced.  The patient placed into a low lithotomy position.  A sterile field was created by prepping and draping  the patient's penis, perineum, and proximal thighs using iodine x3.  Next, cystourethroscopy was performed using a 22-French rigid scope with 20- degree offset lens.  Inspection of the anterior and posterior urethra revealed widely patent urethra, evidence of prior transurethral resection, but no evidence of obstructing prostatic lobes.  Inspection of the urinary bladder revealed an old and well-healed resection site lateral to the right ureteral orifice.  There were no obvious papillary lesions, calcifications, or any evidence of recurrent tumor grossly. The cystoscope was then exchanged with a 26-French ACMI continuous flow resectoscope and using normal saline.  Resection was carefully performed of the previous site and chips set aside for permanent pathology.  A separate base was taken using cold cup biopsy forceps, a biopsy what appeared to be fibromuscular stroma.  This was also set aside for permanent pathology.  Repeat inspection following these maneuvers revealed no evidence of bladder perforation.  Additional hemostasis was obtained using point coagulation current.  Great care was taken during all maneuvers to avoid damage to the right ureter and right ureteral orifice and this did not occur and this was documented in the intraoperative photographs.  Finally, a bilateral retrograde pyelograms were obtained first on the left side.  The left ureteral orifice was  cannulated using a 6-French Foley catheter and left retrograde pyelogram was seen.  Left retrograde pyelogram demonstrated a single left ureter, a single system left kidney.  No filling defects or narrowing noted.  Similarly, right retrograde pyelogram was obtained.  Right retrograde pyelogram to proceed a single right ureter, single system, right kidney.  No filling defects or narrowing noted.  Given the proximity of the resection site at the right ureteral orifice, it was felt that a temporary stenting was warranted.   As such, a 0.038 Sensor wire was advanced at the level of the upper pole over which a new 6 x 26 double-J stent was placed.  Good proximal and distal curl were noted. Bladder was emptied per cystoscope and the tether was left in place and fashioned in the dorsum of the penis.  Procedure was then terminated. The patient tolerated the procedure well.  There were no immediate periprocedural complications.  The patient was taken to postanesthesia care unit in stable condition.          ______________________________ Sebastian Ache, MD     TM/MEDQ  D:  01/29/2013  T:  01/29/2013  Job:  [id number]

## 2015-01-13 ENCOUNTER — Other Ambulatory Visit: Payer: Self-pay

## 2015-01-13 ENCOUNTER — Encounter: Payer: Self-pay | Admitting: Emergency Medicine

## 2015-01-13 ENCOUNTER — Emergency Department
Admission: EM | Admit: 2015-01-13 | Discharge: 2015-01-13 | Disposition: A | Discharge status: Home / Self Care | Payer: Medicare Other | Attending: Emergency Medicine | Admitting: Emergency Medicine

## 2015-01-13 ENCOUNTER — Emergency Department: Payer: Medicare Other

## 2015-01-13 DIAGNOSIS — Z79899 Other long term (current) drug therapy: Secondary | ICD-10-CM | POA: Insufficient documentation

## 2015-01-13 DIAGNOSIS — L559 Sunburn, unspecified: Secondary | ICD-10-CM | POA: Insufficient documentation

## 2015-01-13 DIAGNOSIS — I10 Essential (primary) hypertension: Secondary | ICD-10-CM | POA: Diagnosis not present

## 2015-01-13 DIAGNOSIS — R42 Dizziness and giddiness: Secondary | ICD-10-CM

## 2015-01-13 DIAGNOSIS — E860 Dehydration: Secondary | ICD-10-CM

## 2015-01-13 DIAGNOSIS — M6281 Muscle weakness (generalized): Secondary | ICD-10-CM | POA: Diagnosis present

## 2015-01-13 LAB — CBC WITH DIFFERENTIAL/PLATELET
BASOS ABS: 0 10*3/uL (ref 0–0.1)
BASOS PCT: 0 %
EOS ABS: 0 10*3/uL (ref 0–0.7)
Eosinophils Relative: 0 %
HCT: 39.8 % — ABNORMAL LOW (ref 40.0–52.0)
Hemoglobin: 13.5 g/dL (ref 13.0–18.0)
LYMPHS ABS: 1.8 10*3/uL (ref 1.0–3.6)
Lymphocytes Relative: 41 %
MCH: 29.7 pg (ref 26.0–34.0)
MCHC: 33.8 g/dL (ref 32.0–36.0)
MCV: 87.6 fL (ref 80.0–100.0)
Monocytes Absolute: 0.2 10*3/uL (ref 0.2–1.0)
Monocytes Relative: 6 %
NEUTROS PCT: 53 %
Neutro Abs: 2.4 10*3/uL (ref 1.4–6.5)
PLATELETS: 215 10*3/uL (ref 150–440)
RBC: 4.54 MIL/uL (ref 4.40–5.90)
RDW: 13.3 % (ref 11.5–14.5)
WBC: 4.5 10*3/uL (ref 3.8–10.6)

## 2015-01-13 LAB — URINALYSIS COMPLETE WITH MICROSCOPIC (ARMC ONLY)
Bacteria, UA: NONE SEEN
Bilirubin Urine: NEGATIVE
Glucose, UA: NEGATIVE mg/dL
Hgb urine dipstick: NEGATIVE
Ketones, ur: NEGATIVE mg/dL
Leukocytes, UA: NEGATIVE
Nitrite: NEGATIVE
PROTEIN: NEGATIVE mg/dL
SPECIFIC GRAVITY, URINE: 1.008 (ref 1.005–1.030)
SQUAMOUS EPITHELIAL / LPF: NONE SEEN
pH: 7 (ref 5.0–8.0)

## 2015-01-13 LAB — BASIC METABOLIC PANEL
Anion gap: 7 (ref 5–15)
BUN: 12 mg/dL (ref 6–20)
CALCIUM: 8.7 mg/dL — AB (ref 8.9–10.3)
CO2: 28 mmol/L (ref 22–32)
CREATININE: 0.83 mg/dL (ref 0.61–1.24)
Chloride: 104 mmol/L (ref 101–111)
Glucose, Bld: 110 mg/dL — ABNORMAL HIGH (ref 65–99)
Potassium: 3.8 mmol/L (ref 3.5–5.1)
Sodium: 139 mmol/L (ref 135–145)

## 2015-01-13 LAB — TROPONIN I

## 2015-01-13 LAB — TSH: TSH: 2.101 u[IU]/mL (ref 0.350–4.500)

## 2015-01-13 NOTE — ED Notes (Signed)
Pt to ED from [facility] urgent care with weakness and dizziness since he woke up this am at 0600, states he also felt "off balance" yesterday, has been treated for UTI but has completed the antibiotic, also had episode of confusion about 2 weeks ago

## 2015-01-13 NOTE — ED Provider Notes (Signed)
[facility] Emergency Department Provider Note  ____________________________________________  Time seen: 10:00 AM  I have reviewed the triage vital signs and the nursing notes.   HISTORY  Chief Complaint Weakness and Dizziness    HPI Matthew Walters is a 81 y.o. male who reports generalized weakness and dizziness for the past 2 weeks, worse for the last 1 day. The patient and family note that he has been spending a lot of time outside recently including a recent beach trip in which he sat outside under an umbrella for a prolonged period of time and sustained some sunburns.  Yesterday prior to the symptoms worsening, he mowed 3 acres of grass. Patient and family agree that he does not drink a lot of water and does not eat regularly. His pain or shortness of breath. No fever chills headache or syncope.     Past Medical History  Diagnosis Date  . Hypertension   . Hypothyroidism   . BPH (benign prostatic hypertrophy)   . Bladder cancer   . History of melanoma excision   . Iron deficiency anemia   . History of gastroesophageal reflux (GERD)     PT DENIES  . H/O hiatal hernia     There are no active problems to display for this patient.   Past Surgical History  Procedure Laterality Date  . Esophagogastroduodenoscopy endoscopy      ESOPHAGEAL DILATION  . Transurethral resection of bladder tumor N/A 12/04/2012    Procedure: TRANSURETHRAL RESECTION OF BLADDER TUMOR (TURBT);  Surgeon: Alexis Frock, MD;  Location: [facility];  Service: Urology;  Laterality: N/A;  . Transurethral resection of prostate N/A 12/04/2012    Procedure: TRANSURETHRAL RESECTION OF THE PROSTATE WITH GYRUS INSTRUMENTS;  Surgeon: Alexis Frock, MD;  Location: [facility];  Service: Urology;  Laterality: N/A;  . Cystoscopy w/ retrogrades Bilateral 12/04/2012    Procedure: CYSTOSCOPY WITH RETROGRADE PYELOGRAM, right stent;  Surgeon: Alexis Frock, MD;  Location: [facility];  Service: Urology;   Laterality: Bilateral;  . Melanoma excision      SHOULDER AND WRIST  . Inguinal hernia repair Bilateral   . Transurethral resection of bladder tumor N/A 01/29/2013    Procedure: TRANSURETHRAL RESECTION OF BLADDER TUMOR (TURBT) RESTAGING TURBT;  Surgeon: Alexis Frock, MD;  Location: [facility];  Service: Urology;  Laterality: N/A;  rad tech/c-arm  . Cystoscopy w/ retrogrades Bilateral 01/29/2013    Procedure: CYSTOSCOPY WITH RETROGRADE PYELOGRAM, BILATERAL with insertion right stent;  Surgeon: Alexis Frock, MD;  Location: [facility];  Service: Urology;  Laterality: Bilateral;    Current Outpatient Rx  Name  Route  Sig  Dispense  Refill  . amLODipine (NORVASC) 5 MG tablet   Oral   Take 5 mg by mouth daily before breakfast.         . cholecalciferol (VITAMIN D) 1000 UNITS tablet   Oral   Take 1,000 Units by mouth daily.         . ferrous sulfate 325 (65 FE) MG tablet   Oral   Take 325 mg by mouth daily with breakfast.         . finasteride (PROSCAR) 5 MG tablet   Oral   Take 5 mg by mouth daily.         Marland Kitchen levothyroxine (SYNTHROID, LEVOTHROID) 25 MCG tablet   Oral   Take 25 mcg by mouth daily before breakfast.           Allergies Sulfa antibiotics  No  family history on file.  Social History History  Substance Use Topics  . Smoking status: Never Smoker   . Smokeless tobacco: Never Used  . Alcohol Use: No    Review of Systems  Constitutional: No fever or chills. No weight changes Eyes:No blurry vision or double vision.  ENT: No sore throat. Cardiovascular: No chest pain. Respiratory: No dyspnea or cough. Gastrointestinal: Negative for abdominal pain, vomiting and diarrhea.  No BRBPR or melena. Genitourinary: Negative for dysuria, urinary retention, bloody urine, or difficulty urinating. Musculoskeletal: Negative for back pain. No joint swelling or pain. Skin: Negative for rash. Neurological: Negative for headaches, focal  weakness or numbness. Psychiatric:No anxiety or depression.   Endocrine:No hot/cold intolerance, changes in energy, or sleep difficulty.  10-point ROS otherwise negative.  ____________________________________________   PHYSICAL EXAM:  VITAL SIGNS: ED Triage Vitals  Enc Vitals Group     BP 01/13/15 0949 164/91 mmHg     Pulse Rate 01/13/15 0949 67     Resp 01/13/15 0949 18     Temp 01/13/15 0949 98.1 F (36.7 C)     Temp Source 01/13/15 0949 Oral     SpO2 01/13/15 0949 98 %     Weight 01/13/15 0949 161 lb (73.029 kg)     Height 01/13/15 0949 5\' 6"  (1.676 m)     Head Cir --      Peak Flow --      Pain Score --      Pain Loc --      Pain Edu? --      Excl. in [place]? --      Constitutional: Alert and oriented. Well appearing and in no distress. Eyes: No scleral icterus. No conjunctival pallor. PERRL. EOMI ENT   Head: Normocephalic and atraumatic.   Nose: No congestion/rhinnorhea. No septal hematoma   Mouth/Throat: Dry mucous membranes, no pharyngeal erythema. No peritonsillar mass. No uvula shift.   Neck: No stridor. No SubQ emphysema. No meningismus. Hematological/Lymphatic/Immunilogical: No cervical lymphadenopathy. Cardiovascular: RRR. Normal and symmetric distal pulses are present in all extremities. No murmurs, rubs, or gallops. Respiratory: Normal respiratory effort without tachypnea nor retractions. Breath sounds are clear and equal bilaterally. No wheezes/rales/rhonchi. Gastrointestinal: Soft and nontender. No distention. There is no CVA tenderness.  No rebound, rigidity, or guarding. Genitourinary: deferred Musculoskeletal: Nontender with normal range of motion in all extremities. No joint effusions.  No lower extremity tenderness.  No edema. Neurologic:   Normal speech and language.  CN 2-10 normal. Motor grossly intact. No pronator drift.  Normal gait. No gross focal neurologic deficits are appreciated.  Skin:  Skin is warm, dry and intact. No rash  noted.  No petechiae, purpura, or bullae. Impaired skin turgor Psychiatric: Mood and affect are normal. Speech and behavior are normal. Patient exhibits appropriate insight and judgment.  ____________________________________________    LABS (pertinent positives/negatives) (all labs ordered are listed, but only abnormal results are displayed) Labs Reviewed  URINALYSIS COMPLETEWITH MICROSCOPIC ([facility] ONLY) - Abnormal; Notable for the following:    Color, Urine STRAW (*)    APPearance CLEAR (*)    All other components within normal limits  BASIC METABOLIC PANEL - Abnormal; Notable for the following:    Glucose, Bld 110 (*)    Calcium 8.7 (*)    All other components within normal limits  CBC WITH DIFFERENTIAL/PLATELET - Abnormal; Notable for the following:    HCT 39.8 (*)    All other components within normal limits  TROPONIN I  TSH  ____________________________________________   EKG  Interpreted by me Normal sinus rhythm rate of 67, normal axis and intervals. Poor R-wave progression in anterior precordial leads. voltage criteria for LVH. Normal ST segments and T waves.  ____________________________________________    RADIOLOGY  CT head unremarkable  ____________________________________________   PROCEDURES  ____________________________________________   INITIAL IMPRESSION / ASSESSMENT AND PLAN / ED COURSE  Pertinent labs & imaging results that were available during my care of the patient were reviewed by me and considered in my medical decision making (see chart for details).  Patient presents with likely dehydration by history as a cause for his dizziness and generalized weakness. I'll give him IV fluids will checking a workup of urinalysis blood tests and CT head.  ----------------------------------------- 1:17 PM on 01/13/2015 -----------------------------------------  Workup unremarkable. Patient drinking multiple cups of water and urinating in the ED. He  feels better and is in good spirits. He is well-appearing no acute distress nontoxic medically stable. I have low suspicion for ACS PE TAD pneumothorax ICH stroke vertebrobasilar insufficiency sepsis or other acute pathology.  ____________________________________________   FINAL CLINICAL IMPRESSION(S) / ED DIAGNOSES  Final diagnoses:  Dehydration  Dizziness      Carrie Mew, MD 01/13/15 1318

## 2015-01-13 NOTE — Discharge Instructions (Signed)
Dehydration Dehydration is when you lose more fluids from the body than you take in. Vital organs such as the kidneys, brain, and heart cannot function without a proper amount of fluids and salt. Any loss of fluids from the body can cause dehydration.  Older adults are at a higher risk of dehydration than younger adults. As we age, our bodies are less able to conserve water and do not respond to temperature changes as well. Also, older adults do not become thirsty as easily or quickly. Because of this, older adults often do not realize they need to increase fluids to avoid dehydration.  CAUSES   Vomiting.  Diarrhea.  Excessive sweating.  Excessive urination.  Fever.  Certain medicines, such as blood pressure medicines called diuretics.  Poorly controlled blood sugars. SIGNS AND SYMPTOMS  Mild dehydration:  Thirst.  Dry lips.  Slightly dry mouth. Moderate dehydration:  Very dry mouth.  Sunken eyes.  Skin does not bounce back quickly when lightly pinched and released.  Dark urine and decreased urine production.  Decreased tear production.  Headache. Severe dehydration:  Very dry mouth.  Extreme thirst.  Rapid, weak pulse (more than 100 beats per minute at rest).  Cold hands and feet.  Not able to sweat in spite of heat.  Rapid breathing.  Blue lips.  Confusion and lethargy.  Difficulty being awakened.  Minimal urine production.  No tears. DIAGNOSIS  Your health care provider will diagnose dehydration based on your symptoms and your exam. Blood and urine tests will help confirm the diagnosis. The diagnostic evaluation should also identify the cause of dehydration. TREATMENT  Treatment of mild or moderate dehydration can often be done at home by increasing the amount of fluids that you drink. It is best to drink small amounts of fluid more often. Drinking too much at one time can make vomiting worse. Severe dehydration needs to be treated at the hospital.  You may be given IV fluids that contain water and electrolytes. HOME CARE INSTRUCTIONS   Ask your health care provider about specific rehydration instructions.  Drink enough fluids to keep your urine clear or pale yellow.  Drink small amounts frequently if you have nausea and vomiting.  Eat as you normally do.  Avoid:  Foods or drinks high in sugar.  Carbonated drinks.  Juice.  Extremely hot or cold fluids.  Drinks with caffeine.  Fatty, greasy foods.  Alcohol.  Tobacco.  Overeating.  Gelatin desserts.  Wash your hands well to avoid spreading bacteria and viruses.  Only take over-the-counter or prescription medicines for pain, discomfort, or fever as directed by your health care provider.  Ask your health care provider if you should continue all prescribed and over-the-counter medicines.  Keep all follow-up appointments with your health care provider. SEEK MEDICAL CARE IF:  You have abdominal pain, and it increases or stays in one area (localizes).  You have a rash, stiff neck, or severe headache.  You are irritable, sleepy, or difficult to awaken.  You are weak, dizzy, or extremely thirsty.  You have a fever. SEEK IMMEDIATE MEDICAL CARE IF:   You are unable to keep fluids down, or you get worse despite treatment.  You have frequent episodes of vomiting or diarrhea.  You have blood or green matter (bile) in your vomit.  You have blood in your stool, or your stool looks black and tarry.  You have not urinated in 6-8 hours, or you have only urinated a small amount of very dark urine.    You faint. MAKE SURE YOU:   Understand these instructions.  Will watch your condition.  Will get help right away if you are not doing well or get worse. Document Released: 09/23/2003 Document Revised: 07/08/2013 Document Reviewed: 03/10/2013 ExitCare Patient Information 2015 [employer]. This information is not intended to replace advice given to you by your  health care provider. Make sure you discuss any questions you have with your health care provider.  

## 2016-02-16 ENCOUNTER — Other Ambulatory Visit: Payer: Self-pay | Admitting: Geriatric Medicine

## 2016-02-16 DIAGNOSIS — N6320 Unspecified lump in the left breast, unspecified quadrant: Secondary | ICD-10-CM

## 2016-03-06 ENCOUNTER — Ambulatory Visit
Admission: RE | Admit: 2016-03-06 | Discharge: 2016-03-06 | Disposition: A | Discharge status: Home / Self Care | Payer: Medicare Other | Source: Ambulatory Visit | Attending: Geriatric Medicine | Admitting: Geriatric Medicine

## 2016-03-06 DIAGNOSIS — N62 Hypertrophy of breast: Secondary | ICD-10-CM | POA: Diagnosis not present

## 2016-03-06 DIAGNOSIS — N6320 Unspecified lump in the left breast, unspecified quadrant: Secondary | ICD-10-CM

## 2016-03-06 DIAGNOSIS — N63 Unspecified lump in breast: Secondary | ICD-10-CM | POA: Diagnosis present

## 2016-06-07 ENCOUNTER — Telehealth: Payer: Self-pay | Admitting: Neurology

## 2016-06-07 ENCOUNTER — Ambulatory Visit (INDEPENDENT_AMBULATORY_CARE_PROVIDER_SITE_OTHER): Payer: Medicare Other | Admitting: Neurology

## 2016-06-07 ENCOUNTER — Encounter: Payer: Self-pay | Admitting: Neurology

## 2016-06-07 VITALS — BP 146/95 | HR 70 | Ht 63.0 in | Wt 161.8 lb

## 2016-06-07 DIAGNOSIS — R42 Dizziness and giddiness: Secondary | ICD-10-CM

## 2016-06-07 DIAGNOSIS — G450 Vertebro-basilar artery syndrome: Secondary | ICD-10-CM | POA: Diagnosis not present

## 2016-06-07 MED ORDER — ASPIRIN EC 81 MG PO TBEC: 81.0000 mg | DELAYED_RELEASE_TABLET | Freq: Every day | ORAL | 0 refills | Status: AC

## 2016-06-07 NOTE — Telephone Encounter (Signed)
Pt's wife called says Dr Leonie Man wanted pt to take 1 baby aspirin/day. Says she forgot to tell him the pt is allergic to aspirin. Please call

## 2016-06-07 NOTE — Telephone Encounter (Signed)
Agree with plan 

## 2016-06-07 NOTE — Telephone Encounter (Signed)
Rn call patients wife back about her husband being allergic to aspirin. Rn stated it was not mention during the visit today and it was not on the referral. Rn ask pts wife if patient has taken in the past. Patient's wife stated "he has never taken aspirin before but he a lining issue of his stomach in the past". Rn stated aspirin cannot be put down unless he has had taken it, and had a reaction to it. Rn advised wife to let husband take aspirin 81mg  per Dr.Sethi note. Rn recommend the pt and wife to call back if the patient starts having side effects. Pts wife verbalized understanding.

## 2016-06-07 NOTE — Patient Instructions (Addendum)
Fall Prevention in the Home Introduction Falls can cause injuries. They can happen to people of all ages. There are many things you can do to make your home safe and to help prevent falls. What can I do on the outside of my home?  Regularly fix the edges of walkways and driveways and fix any cracks.  Remove anything that might make you trip as you walk through a door, such as a raised step or threshold.  Trim any bushes or trees on the path to your home.  Use bright outdoor lighting.  Clear any walking paths of anything that might make someone trip, such as rocks or tools.  Regularly check to see if handrails are loose or broken. Make sure that both sides of any steps have handrails.  Any raised decks and porches should have guardrails on the edges.  Have any leaves, snow, or ice cleared regularly.  Use sand or salt on walking paths during winter.  Clean up any spills in your garage right away. This includes oil or grease spills. What can I do in the bathroom?  Use night lights.  Install grab bars by the toilet and in the tub and shower. Do not use towel bars as grab bars.  Use non-skid mats or decals in the tub or shower.  If you need to sit down in the shower, use a plastic, non-slip stool.  Keep the floor dry. Clean up any water that spills on the floor as soon as it happens.  Remove soap buildup in the tub or shower regularly.  Attach bath mats securely with double-sided non-slip rug tape.  Do not have throw rugs and other things on the floor that can make you trip. What can I do in the bedroom?  Use night lights.  Make sure that you have a light by your bed that is easy to reach.  Do not use any sheets or blankets that are too big for your bed. They should not hang down onto the floor.  Have a firm chair that has side arms. You can use this for support while you get dressed.  Do not have throw rugs and other things on the floor that can make you trip. What can  I do in the kitchen?  Clean up any spills right away.  Avoid walking on wet floors.  Keep items that you use a lot in easy-to-reach places.  If you need to reach something above you, use a strong step stool that has a grab bar.  Keep electrical cords out of the way.  Do not use floor polish or wax that makes floors slippery. If you must use wax, use non-skid floor wax.  Do not have throw rugs and other things on the floor that can make you trip. What can I do with my stairs?  Do not leave any items on the stairs.  Make sure that there are handrails on both sides of the stairs and use them. Fix handrails that are broken or loose. Make sure that handrails are as long as the stairways.  Check any carpeting to make sure that it is firmly attached to the stairs. Fix any carpet that is loose or worn.  Avoid having throw rugs at the top or bottom of the stairs. If you do have throw rugs, attach them to the floor with carpet tape.  Make sure that you have a light switch at the top of the stairs and the bottom of the stairs. If you   do not have them, ask someone to add them for you. What else can I do to help prevent falls?  Wear shoes that:  Do not have high heels.  Have rubber bottoms.  Are comfortable and fit you well.  Are closed at the toe. Do not wear sandals.  If you use a stepladder:  Make sure that it is fully opened. Do not climb a closed stepladder.  Make sure that both sides of the stepladder are locked into place.  Ask someone to hold it for you, if possible.  Clearly mark and make sure that you can see:  Any grab bars or handrails.  First and last steps.  Where the edge of each step is.  Use tools that help you move around (mobility aids) if they are needed. These include:  Canes.  Walkers.  Scooters.  Crutches.  Turn on the lights when you go into a dark area. Replace any light bulbs as soon as they burn out.  Set up your furniture so you have a  clear path. Avoid moving your furniture around.  If any of your floors are uneven, fix them.  If there are any pets around you, be aware of where they are.  Review your medicines with your doctor. Some medicines can make you feel dizzy. This can increase your chance of falling. Ask your doctor what other things that you can do to help prevent falls. This information is not intended to replace advice given to you by your health care provider. Make sure you discuss any questions you have with your health care provider. Document Released: 04/29/2009 Document Revised: 12/09/2015 Document Reviewed: 08/07/2014  2017 Elsevier  

## 2016-06-07 NOTE — Progress Notes (Signed)
[facility] [address] [place]. [place] [address] [phone]       OFFICE CONSULT NOTE  Matthew Walters Date of Birth:  03-14-1929 Medical Record Number:  [id number]   Referring MD:  Earle Gell Reason for Referral: Dizzy spells  HPI: Matthew Walters is a 82 year old pleasant Caucasian male who is accompanied today by his wife. He states that his dizziness began about a month ago and he notices to be of gradual onset. He describes this as being episodic and unpredictable without clear triggers. He describes a sensation of feeling swimmy headed with the wording mowing but not rapidly quickly. He feels his off balance but he can hold on and he is had no falls. He has some good days and bad days and this dizziness feeling subjectively last for a few hours. It does appear to improve when he lies down. He denies true vertigo, nausea, blurred vision, slurred speech, extremity weakness and numbness. His has not noticed any changes in his hearing or hearing loss. He does have long-standing history of tinnitus in both ears intermittently for the last 4 years or so. He denies any prior history of significant head injury with loss of consciousness, ear pain, ear infection her surgery. The patient had to have orthostatic blood pressures checked in my office visit today and he was not symptomatic and there was no significant drop in his blood pressure. The patient actually complained of these symptoms to Dr. Earle Gell in March and he was at that time on Norvasc 10 mg daily. Dr. Wynetta Emery felt the symptoms may be related to orthostasis and reduced the dose of amlodipine to 5 mg daily but it did not help the patient but since the blood pressure started going up he was placed back on 10 mg daily. The patient denies tingling numbness burning in his feet. He does however admit that his balance difficulties are more pronounced in the dark: Is trying to climb up and down the steps. He denies  significant neck pain or radicular pain and lack of bladder or bowel control. Interestingly the patient has 3 children who were diagnosed with Mnire's disease in their 85s and he is interested to know whether his symptoms could match that. Patient is retired he is to work as a Engineer, drilling he denies drinking alcohol or smoking. He has not had any recent lab work or brain imaging studies done.  ROS:   14 system review of systems is positive for  ringing in the ears, spent sensation, memory loss, headache, tumors sleep, decreased energy, change in appetite and all other systems negative  PMH:  Past Medical History:  Diagnosis Date  . Bladder cancer ([place])   . BPH (benign prostatic hypertrophy)   . H/O hiatal hernia   . History of gastroesophageal reflux (GERD)    PT DENIES  . History of melanoma excision   . Hypertension   . Hypothyroidism   . Iron deficiency anemia     Social History:  Social History   Social History  . Marital status: Married    Spouse name: N/A  . Number of children: N/A  . Years of education: N/A   Occupational History  . Not on file.   Social History Main Topics  . Smoking status: Never Smoker  . Smokeless tobacco: Never Used  . Alcohol use No  . Drug use: No  . Sexual activity: Not on file   Other Topics Concern  . Not on file  Social History Narrative  . No narrative on file    Medications:   Current Outpatient Prescriptions on File Prior to Visit  Medication Sig Dispense Refill  . amLODipine (NORVASC) 5 MG tablet Take 10 mg by mouth daily before breakfast.     . cholecalciferol (VITAMIN D) 1000 UNITS tablet Take 1,000 Units by mouth daily.    . ferrous sulfate 325 (65 FE) MG tablet Take 325 mg by mouth daily with breakfast.    . finasteride (PROSCAR) 5 MG tablet Take 5 mg by mouth daily.    Marland Kitchen levothyroxine (SYNTHROID, LEVOTHROID) 25 MCG tablet Take 25 mcg by mouth daily before breakfast.     No current facility-administered medications on  file prior to visit.     Allergies:   Allergies  Allergen Reactions  . Other Other (See Comments)  . Sulfa Antibiotics Other (See Comments)    unknown    Physical Exam General: well developed, well nourished, seated, in no evident distress Head: head normocephalic and atraumatic.   Neck: supple with no carotid or supraclavicular bruits Cardiovascular: regular rate and rhythm, no murmurs Musculoskeletal: no deformity Skin:  no rash/petichiae Vascular:  Normal pulses all extremities  Neurologic Exam Mental Status: Awake and fully alert. Oriented to place and time. Recent and remote memory intact. Attention span, concentration and fund of knowledge appropriate. Mood and affect appropriate.  Cranial Nerves: Fundoscopic exam reveals sharp disc margins. Pupils equal, briskly reactive to light. Extraocular movements full without nystagmus. Visual fields full to confrontation. Hearing intact. Facial sensation intact. Face, tongue, palate moves normally and symmetrically.  Motor: Normal bulk and tone. Normal strength in all tested extremity muscles. Sensory.: Slight hyperesthesia to touch and pinprick in both feet from ankle down. Impaired vibration sensation from ankle down bilaterally. Positions sensation slightly impaired over the toes bilaterally. Romberg sign is weakly positive. Head shaking do not produce any nystagmus or subjective dizziness. Patient not able to complete Fukuda stepping test. Hallpike maneuver not done. Coordination: Rapid alternating movements normal in all extremities. Finger-to-nose and heel-to-shin performed accurately bilaterally. Gait and Station: Arises from chair without difficulty. Stance is broad-based. Slight imbalance while making that time and trying to walk quickly.. Unable to heel, toe and tandem walk   Reflexes: 1+ and symmetric. Toes downgoing.       ASSESSMENT: 89 year Caucasian male with one-month history of dizziness episodes of unclear etiology.  Possibilities include vertebrobasilar insufficiency versus peripheral vascular dysfunction as well as underlying peripheral neuropathy.    PLAN: I had a long discussion with the patient and his wife regarding his episodes of intermittent dizziness and vertigo and discussed my differential diagnosis, plan for evaluation and treatment. I recommend we check MRI scan of the brain and MRA of the brain and neck for vertebrobasilar ischemia as well as check EMG nerve conduction study of her peripheral neuropathy. Start aspirin 81 mg daily for stroke prevention. Check vitamin B12, hemoglobin A1c, ANA and complete metabolic panel and CBC. I have advised the patient to get up slowly and avoid certain moments. We also discussed fall and safety precautions. Greater than 50% time during this 45 minute consultation visit was spent on counseling and coordination of about his dizziness and answering questions He will return for follow-up in 2 months or call earlier if necessary. Antony Contras, MD  [facility] [address] [place], [place] [address]  Phone [phone] Fax [phone] Note: This document was prepared with digital dictation and possible smart phrase technology. Any  transcriptional errors that result from this process are unintentional.

## 2016-06-08 LAB — COMPREHENSIVE METABOLIC PANEL
ALBUMIN: 4 g/dL (ref 3.5–4.7)
ALT: 13 IU/L (ref 0–44)
AST: 13 IU/L (ref 0–40)
Albumin/Globulin Ratio: 1.6 (ref 1.2–2.2)
Alkaline Phosphatase: 99 IU/L (ref 39–117)
BUN / CREAT RATIO: 12 (ref 10–24)
BUN: 10 mg/dL (ref 8–27)
Bilirubin Total: 0.4 mg/dL (ref 0.0–1.2)
CALCIUM: 9 mg/dL (ref 8.6–10.2)
CO2: 28 mmol/L (ref 18–29)
CREATININE: 0.82 mg/dL (ref 0.76–1.27)
Chloride: 100 mmol/L (ref 96–106)
GFR, EST AFRICAN AMERICAN: 93 mL/min/{1.73_m2} (ref >59)
GFR, EST NON AFRICAN AMERICAN: 81 mL/min/{1.73_m2} (ref >59)
GLUCOSE: 98 mg/dL (ref 65–99)
Globulin, Total: 2.5 g/dL (ref 1.5–4.5)
Potassium: 4.1 mmol/L (ref 3.5–5.2)
Sodium: 141 mmol/L (ref 134–144)
TOTAL PROTEIN: 6.5 g/dL (ref 6.0–8.5)

## 2016-06-08 LAB — TSH: TSH: 2.68 u[IU]/mL (ref 0.450–4.500)

## 2016-06-08 LAB — VITAMIN B12: Vitamin B-12: 260 pg/mL (ref 211–946)

## 2016-06-08 LAB — CBC
HEMATOCRIT: 41.6 % (ref 37.5–51.0)
HEMOGLOBIN: 14.5 g/dL (ref 12.6–17.7)
MCH: 30.9 pg (ref 26.6–33.0)
MCHC: 34.9 g/dL (ref 31.5–35.7)
MCV: 89 fL (ref 79–97)
Platelets: 193 10*3/uL (ref 150–379)
RBC: 4.7 x10E6/uL (ref 4.14–5.80)
RDW: 13.6 % (ref 12.3–15.4)
WBC: 4.8 10*3/uL (ref 3.4–10.8)

## 2016-06-08 LAB — HEMOGLOBIN A1C
ESTIMATED AVERAGE GLUCOSE: 105 mg/dL
Hgb A1c MFr Bld: 5.3 % (ref 4.8–5.6)

## 2016-06-12 ENCOUNTER — Telehealth: Payer: Self-pay

## 2016-06-12 NOTE — Telephone Encounter (Signed)
LFt vm for patient to call back about lab work results.

## 2016-06-12 NOTE — Telephone Encounter (Signed)
-----   Message from Garvin Fila, MD sent at 06/12/2016  3:53 PM EST ----- Kindly inform the patient that all blood work including comprehensive metabolic panel, CBC, screening test for diabetes, thyroid hormone and vitamin B12 were normal.

## 2016-06-13 NOTE — Telephone Encounter (Signed)
Rn call patients wife on dpr about lab work. Rn stated the CBC, Vitamin b 12, thyroid hormone, CMP, and screening for diabetes.Pts wife verbalized understanding. ------

## 2016-06-13 NOTE — Telephone Encounter (Signed)
Pt's wife returned RN's call °

## 2016-06-20 ENCOUNTER — Ambulatory Visit
Admission: RE | Admit: 2016-06-20 | Discharge: 2016-06-20 | Disposition: A | Discharge status: Home / Self Care | Payer: Medicare Other | Source: Ambulatory Visit | Attending: Neurology | Admitting: Neurology

## 2016-06-20 DIAGNOSIS — R42 Dizziness and giddiness: Secondary | ICD-10-CM

## 2016-06-20 DIAGNOSIS — G450 Vertebro-basilar artery syndrome: Secondary | ICD-10-CM

## 2016-06-20 MED ORDER — GADOBENATE DIMEGLUMINE 529 MG/ML IV SOLN
15.0000 mL | Freq: Once | INTRAVENOUS | Status: AC | PRN
  Administered 2016-06-20: 15 mL via INTRAVENOUS

## 2016-06-26 ENCOUNTER — Telehealth: Payer: Self-pay | Admitting: Neurology

## 2016-06-26 NOTE — Telephone Encounter (Signed)
PT WIFE [phone] REQ RET CALL FOR RESULTS OF MRI AND CT SCAN.

## 2016-06-27 NOTE — Telephone Encounter (Signed)
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 MRA of the neck showed no significant carotid stenosis. 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 MRA of the brain shows no major stenosis and decreased caliber of right vertebral artery which is likely birth variant also. She voiced understanding.

## 2016-06-27 NOTE — Telephone Encounter (Signed)
Message sent to Dr. Sethi. 

## 2016-06-29 ENCOUNTER — Encounter: Payer: Medicare Other | Admitting: Diagnostic Neuroimaging

## 2016-07-13 ENCOUNTER — Encounter: Payer: Medicare Other | Admitting: Diagnostic Neuroimaging

## 2016-08-10 ENCOUNTER — Encounter: Payer: Medicare Other | Admitting: Diagnostic Neuroimaging

## 2016-08-14 ENCOUNTER — Encounter: Payer: Self-pay | Admitting: Diagnostic Neuroimaging

## 2016-08-15 ENCOUNTER — Ambulatory Visit: Payer: Medicare Other | Admitting: Neurology

## 2017-09-03 ENCOUNTER — Inpatient Hospital Stay: Payer: Medicare Other | Admitting: Internal Medicine

## 2017-09-03 NOTE — Progress Notes (Deleted)
[facility] NOTE  Patient Care Team: Garlan Fair, MD as PCP - General (Gastroenterology)  CHIEF COMPLAINTS/PURPOSE OF CONSULTATION:  ***  #   No history exists.     HISTORY OF PRESENTING ILLNESS:  Matthew Walters 84 y.o.  male     ROS: A complete 10 point review of system is done which is negative except mentioned above in history of present illness  MEDICAL HISTORY:  Past Medical History:  Diagnosis Date  . Bladder cancer ([place])   . BPH (benign prostatic hypertrophy)   . H/O hiatal hernia   . History of gastroesophageal reflux (GERD)    PT DENIES  . History of melanoma excision   . Hypertension   . Hypothyroidism   . Iron deficiency anemia     SURGICAL HISTORY: Past Surgical History:  Procedure Laterality Date  . CYSTOSCOPY W/ RETROGRADES Bilateral 12/04/2012   Procedure: CYSTOSCOPY WITH RETROGRADE PYELOGRAM, right stent;  Surgeon: Alexis Frock, MD;  Location: [facility];  Service: Urology;  Laterality: Bilateral;  . CYSTOSCOPY W/ RETROGRADES Bilateral 01/29/2013   Procedure: CYSTOSCOPY WITH RETROGRADE PYELOGRAM, BILATERAL with insertion right stent;  Surgeon: Alexis Frock, MD;  Location: [facility];  Service: Urology;  Laterality: Bilateral;  . ESOPHAGOGASTRODUODENOSCOPY ENDOSCOPY     ESOPHAGEAL DILATION  . INGUINAL HERNIA REPAIR Bilateral   . MELANOMA EXCISION     SHOULDER AND WRIST  . TRANSURETHRAL RESECTION OF BLADDER TUMOR N/A 12/04/2012   Procedure: TRANSURETHRAL RESECTION OF BLADDER TUMOR (TURBT);  Surgeon: Alexis Frock, MD;  Location: [facility];  Service: Urology;  Laterality: N/A;  . TRANSURETHRAL RESECTION OF BLADDER TUMOR N/A 01/29/2013   Procedure: TRANSURETHRAL RESECTION OF BLADDER TUMOR (TURBT) RESTAGING TURBT;  Surgeon: Alexis Frock, MD;  Location: [facility];  Service: Urology;  Laterality: N/A;  rad tech/c-arm  . TRANSURETHRAL RESECTION OF PROSTATE N/A 12/04/2012   Procedure: TRANSURETHRAL  RESECTION OF THE PROSTATE WITH GYRUS INSTRUMENTS;  Surgeon: Alexis Frock, MD;  Location: [facility];  Service: Urology;  Laterality: N/A;    SOCIAL HISTORY: Social History   Socioeconomic History  . Marital status: Married    Spouse name: Not on file  . Number of children: Not on file  . Years of education: Not on file  . Highest education level: Not on file  Social Needs  . Financial resource strain: Not on file  . Food insecurity - worry: Not on file  . Food insecurity - inability: Not on file  . Transportation needs - medical: Not on file  . Transportation needs - non-medical: Not on file  Occupational History  . Not on file  Tobacco Use  . Smoking status: Never Smoker  . Smokeless tobacco: Never Used  Substance and Sexual Activity  . Alcohol use: No  . Drug use: No  . Sexual activity: Not on file  Other Topics Concern  . Not on file  Social History Narrative  . Not on file    FAMILY HISTORY: Family History  Problem Relation Age of Onset  . Dementia Mother     ALLERGIES:  is allergic to other and sulfa antibiotics.  MEDICATIONS:  Current Outpatient Medications  Medication Sig Dispense Refill  . amLODipine (NORVASC) 5 MG tablet Take 10 mg by mouth daily before breakfast.     . aspirin EC 81 MG tablet Take 1 tablet (81 mg total) by mouth daily. 1 tablet 0  . cholecalciferol (VITAMIN D) 1000 UNITS tablet Take 1,000 Units by mouth  daily.    . ferrous sulfate 325 (65 FE) MG tablet Take 325 mg by mouth daily with breakfast.    . finasteride (PROSCAR) 5 MG tablet Take 5 mg by mouth daily.    Marland Kitchen levothyroxine (SYNTHROID, LEVOTHROID) 25 MCG tablet Take 25 mcg by mouth daily before breakfast.     No current facility-administered medications for this visit.       Marland Kitchen  PHYSICAL EXAMINATION: ECOG PERFORMANCE STATUS: {CHL ONC ECOG [phone]}  There were no vitals filed for this visit. There were no vitals filed for this visit.  GENERAL: Well-nourished  well-developed; Alert, no distress and comfortable.   *** EYES: no pallor or icterus OROPHARYNX: no thrush or ulceration; good dentition  NECK: supple, no masses felt LYMPH:  no palpable lymphadenopathy in the cervical, axillary or inguinal regions LUNGS: clear to auscultation and  No wheeze or crackles HEART/CVS: regular rate & rhythm and no murmurs; No lower extremity edema ABDOMEN: abdomen soft, non-tender and normal bowel sounds Musculoskeletal:no cyanosis of digits and no clubbing  PSYCH: alert & oriented x 3 with fluent speech NEURO: no focal motor/sensory deficits SKIN:  no rashes or significant lesions  LABORATORY DATA:  I have reviewed the data as listed Lab Results  Component Value Date   WBC 4.8 06/07/2016   HGB 14.5 06/07/2016   HCT 41.6 06/07/2016   MCV 89 06/07/2016   PLT 193 06/07/2016   No results for input(s): NA, K, CL, CO2, GLUCOSE, BUN, CREATININE, CALCIUM, GFRNONAA, GFRAA, PROT, ALBUMIN, AST, ALT, ALKPHOS, BILITOT, BILIDIR, IBILI in the last 8760 hours.  RADIOGRAPHIC STUDIES: I have personally reviewed the radiological images as listed and agreed with the findings in the report. No results found.  ASSESSMENT & PLAN:   No problem-specific Assessment & Plan notes found for this encounter.    All questions were answered. The patient knows to call the clinic with any problems, questions or concerns.       Cammie Sickle, MD 09/03/2017 2:04 PM

## 2017-09-10 ENCOUNTER — Telehealth: Payer: Self-pay | Admitting: Internal Medicine

## 2017-09-10 ENCOUNTER — Encounter (INDEPENDENT_AMBULATORY_CARE_PROVIDER_SITE_OTHER): Payer: Self-pay

## 2017-09-10 ENCOUNTER — Inpatient Hospital Stay: Payer: Medicare Other | Attending: Internal Medicine | Admitting: Internal Medicine

## 2017-09-10 ENCOUNTER — Inpatient Hospital Stay: Payer: Medicare Other

## 2017-09-10 DIAGNOSIS — R161 Splenomegaly, not elsewhere classified: Secondary | ICD-10-CM | POA: Diagnosis not present

## 2017-09-10 DIAGNOSIS — Z7982 Long term (current) use of aspirin: Secondary | ICD-10-CM | POA: Diagnosis not present

## 2017-09-10 DIAGNOSIS — D61818 Other pancytopenia: Secondary | ICD-10-CM | POA: Diagnosis not present

## 2017-09-10 DIAGNOSIS — E039 Hypothyroidism, unspecified: Secondary | ICD-10-CM | POA: Insufficient documentation

## 2017-09-10 DIAGNOSIS — K449 Diaphragmatic hernia without obstruction or gangrene: Secondary | ICD-10-CM | POA: Insufficient documentation

## 2017-09-10 DIAGNOSIS — Z87891 Personal history of nicotine dependence: Secondary | ICD-10-CM | POA: Insufficient documentation

## 2017-09-10 DIAGNOSIS — N400 Enlarged prostate without lower urinary tract symptoms: Secondary | ICD-10-CM | POA: Insufficient documentation

## 2017-09-10 DIAGNOSIS — F0390 Unspecified dementia without behavioral disturbance: Secondary | ICD-10-CM | POA: Diagnosis not present

## 2017-09-10 DIAGNOSIS — K219 Gastro-esophageal reflux disease without esophagitis: Secondary | ICD-10-CM | POA: Insufficient documentation

## 2017-09-10 DIAGNOSIS — Z79899 Other long term (current) drug therapy: Secondary | ICD-10-CM | POA: Insufficient documentation

## 2017-09-10 DIAGNOSIS — Z8551 Personal history of malignant neoplasm of bladder: Secondary | ICD-10-CM | POA: Insufficient documentation

## 2017-09-10 DIAGNOSIS — E538 Deficiency of other specified B group vitamins: Secondary | ICD-10-CM | POA: Diagnosis not present

## 2017-09-10 DIAGNOSIS — I10 Essential (primary) hypertension: Secondary | ICD-10-CM | POA: Insufficient documentation

## 2017-09-10 DIAGNOSIS — Z85820 Personal history of malignant melanoma of skin: Secondary | ICD-10-CM | POA: Diagnosis not present

## 2017-09-10 LAB — CBC WITH DIFFERENTIAL/PLATELET
BASOS ABS: 0 10*3/uL (ref 0–0.1)
Basophils Relative: 1 %
EOS PCT: 0 %
Eosinophils Absolute: 0 10*3/uL (ref 0–0.7)
HCT: 35.2 % — ABNORMAL LOW (ref 40.0–52.0)
HEMOGLOBIN: 12.7 g/dL — AB (ref 13.0–18.0)
LYMPHS ABS: 1.2 10*3/uL (ref 1.0–3.6)
Lymphocytes Relative: 70 %
MCH: 30.4 pg (ref 26.0–34.0)
MCHC: 36 g/dL (ref 32.0–36.0)
MCV: 84.4 fL (ref 80.0–100.0)
MONOS PCT: 9 %
Monocytes Absolute: 0.2 10*3/uL (ref 0.2–1.0)
NEUTROS ABS: 0.4 10*3/uL — AB (ref 1.4–6.5)
Neutrophils Relative %: 20 %
PLATELETS: 84 10*3/uL — AB (ref 150–440)
RBC: 4.17 MIL/uL — ABNORMAL LOW (ref 4.40–5.90)
RDW: 15.2 % — ABNORMAL HIGH (ref 11.5–14.5)
WBC: 1.8 10*3/uL — ABNORMAL LOW (ref 3.8–10.6)

## 2017-09-10 LAB — RETICULOCYTES
RBC.: 4.17 MIL/uL — ABNORMAL LOW (ref 4.40–5.90)
RETIC CT PCT: 2 % (ref 0.4–3.1)
Retic Count, Absolute: 83.4 10*3/uL (ref 19.0–183.0)

## 2017-09-10 LAB — COMPREHENSIVE METABOLIC PANEL
ALBUMIN: 3.8 g/dL (ref 3.5–5.0)
ALK PHOS: 159 U/L — AB (ref 38–126)
ALT: 37 U/L (ref 17–63)
AST: 40 U/L (ref 15–41)
Anion gap: 9 (ref 5–15)
BILIRUBIN TOTAL: 1 mg/dL (ref 0.3–1.2)
BUN: 13 mg/dL (ref 6–20)
CALCIUM: 8.3 mg/dL — AB (ref 8.9–10.3)
CO2: 26 mmol/L (ref 22–32)
CREATININE: 0.84 mg/dL (ref 0.61–1.24)
Chloride: 101 mmol/L (ref 101–111)
GFR calc Af Amer: >60 mL/min (ref >60)
GLUCOSE: 99 mg/dL (ref 65–99)
Potassium: 3.7 mmol/L (ref 3.5–5.1)
Sodium: 136 mmol/L (ref 135–145)
TOTAL PROTEIN: 6.8 g/dL (ref 6.5–8.1)

## 2017-09-10 LAB — TECHNOLOGIST SMEAR REVIEW

## 2017-09-10 LAB — LACTATE DEHYDROGENASE: LDH: 226 U/L — AB (ref 98–192)

## 2017-09-10 NOTE — Telephone Encounter (Signed)
I spoke with Matthew Walters in c.ctr. Lab. She will review smear.

## 2017-09-10 NOTE — Telephone Encounter (Signed)
I ordered "Tech review of smear" for this pt- please make sure that is done. Thx

## 2017-09-10 NOTE — Progress Notes (Signed)
[facility] NOTE  Patient Care Team: Adin Hector, MD as PCP - General (Internal Medicine)  CHIEF COMPLAINTS/PURPOSE OF CONSULTATION:  pancytopenia  #PANCYTOPENIA- Matthew Walters 2019]-white count 2.5; ANC 300; hemoglobin 12.5; platelets 70s-90s  #Mild dementia [as per family]; Jan 2019-B12 deficiency.    No history exists.     HISTORY OF PRESENTING ILLNESS:  Matthew Walters 84 y.o.  male with no significant past medical history-except for mild dementia [as per wife]-noted to have low blood counts on incidental workup with his PCP.  He has been referred to [place] for further evaluation recommendations.  Patient denies any frequent infectious episode.  Denies any nausea vomiting.  Appetite is fair.  Positive for weight loss.  No night sweats.  No low-grade fevers.  Energy levels are adequate.  No swelling in the legs.  No lumps or bumps.  Patient denies any easy bruising or gum bleeding or nosebleeds.  As per the wife- "patient has been anemic all his life"; he is on iron pills.  Review of recent blood work from PCPs office shows-low B12 in January 2019; for which he received a B12 injection/started on pills.  However, the blood counts have not improved.  ROS: A complete 10 point review of system is done which is negative except mentioned above in history of present illness  MEDICAL HISTORY:  Past Medical History:  Diagnosis Date  . Bladder cancer ([place])   . BPH (benign prostatic hypertrophy)   . H/O hiatal hernia   . History of gastroesophageal reflux (GERD)    PT DENIES  . History of melanoma excision   . Hypertension   . Hypothyroidism   . Iron deficiency anemia     SURGICAL HISTORY: Past Surgical History:  Procedure Laterality Date  . CYSTOSCOPY W/ RETROGRADES Bilateral 12/04/2012   Procedure: CYSTOSCOPY WITH RETROGRADE PYELOGRAM, right stent;  Surgeon: Alexis Frock, MD;  Location: [facility];  Service: Urology;  Laterality: Bilateral;  . CYSTOSCOPY W/  RETROGRADES Bilateral 01/29/2013   Procedure: CYSTOSCOPY WITH RETROGRADE PYELOGRAM, BILATERAL with insertion right stent;  Surgeon: Alexis Frock, MD;  Location: [facility];  Service: Urology;  Laterality: Bilateral;  . ESOPHAGOGASTRODUODENOSCOPY ENDOSCOPY     ESOPHAGEAL DILATION  . INGUINAL HERNIA REPAIR Bilateral   . MELANOMA EXCISION     SHOULDER AND WRIST  . TRANSURETHRAL RESECTION OF BLADDER TUMOR N/A 12/04/2012   Procedure: TRANSURETHRAL RESECTION OF BLADDER TUMOR (TURBT);  Surgeon: Alexis Frock, MD;  Location: [facility];  Service: Urology;  Laterality: N/A;  . TRANSURETHRAL RESECTION OF BLADDER TUMOR N/A 01/29/2013   Procedure: TRANSURETHRAL RESECTION OF BLADDER TUMOR (TURBT) RESTAGING TURBT;  Surgeon: Alexis Frock, MD;  Location: [facility];  Service: Urology;  Laterality: N/A;  rad tech/c-arm  . TRANSURETHRAL RESECTION OF PROSTATE N/A 12/04/2012   Procedure: TRANSURETHRAL RESECTION OF THE PROSTATE WITH GYRUS INSTRUMENTS;  Surgeon: Alexis Frock, MD;  Location: [facility];  Service: Urology;  Laterality: N/A;    SOCIAL HISTORY: quit smoking 55 years ago; no alcohol; in [place]. Worked on [employer].  Social History   Socioeconomic History  . Marital status: Married    Spouse name: Not on file  . Number of children: Not on file  . Years of education: Not on file  . Highest education level: Not on file  Social Needs  . Financial resource strain: Not on file  . Food insecurity - worry: Not on file  . Food insecurity - inability: Not on file  .  Transportation needs - medical: Not on file  . Transportation needs - non-medical: Not on file  Occupational History  . Not on file  Tobacco Use  . Smoking status: Never Smoker  . Smokeless tobacco: Never Used  Substance and Sexual Activity  . Alcohol use: No  . Drug use: No  . Sexual activity: Not on file  Other Topics Concern  . Not on file  Social History Narrative  . Not on file    FAMILY  HISTORY: Family History  Problem Relation Age of Onset  . Dementia Mother     ALLERGIES:  is allergic to aspirin; other; and sulfa antibiotics.  MEDICATIONS:  Current Outpatient Medications  Medication Sig Dispense Refill  . amLODipine (NORVASC) 5 MG tablet Take 10 mg by mouth daily before breakfast.     . aspirin EC 81 MG tablet Take 1 tablet (81 mg total) by mouth daily. 1 tablet 0  . cholecalciferol (VITAMIN D) 1000 UNITS tablet Take 1,000 Units by mouth daily.    . ferrous sulfate 325 (65 FE) MG tablet Take 325 mg by mouth daily with breakfast.    . finasteride (PROSCAR) 5 MG tablet Take 5 mg by mouth daily.    Marland Kitchen levothyroxine (SYNTHROID, LEVOTHROID) 25 MCG tablet Take 25 mcg by mouth daily before breakfast.     No current facility-administered medications for this visit.       Marland Kitchen  PHYSICAL EXAMINATION: ECOG PERFORMANCE STATUS: 0 - Asymptomatic  There were no vitals filed for this visit. There were no vitals filed for this visit.  GENERAL: Well-nourished well-developed; Alert, no distress and comfortable.   Accompanied by his wife. EYES: no pallor or icterus OROPHARYNX: no thrush or ulceration; good dentition  NECK: supple, no masses felt LYMPH:  no palpable lymphadenopathy in the cervical, axillary or inguinal regions LUNGS: clear to auscultation and  No wheeze or crackles HEART/CVS: regular rate & rhythm and no murmurs; No lower extremity edema ABDOMEN: abdomen soft, non-tender and normal bowel sounds Musculoskeletal:no cyanosis of digits and no clubbing  PSYCH: alert & oriented x 3 with fluent speech NEURO: no focal motor/sensory deficits SKIN:  no rashes or significant lesions  LABORATORY DATA:  I have reviewed the data as listed Lab Results  Component Value Date   WBC 4.8 06/07/2016   HGB 14.5 06/07/2016   HCT 41.6 06/07/2016   MCV 89 06/07/2016   PLT 193 06/07/2016   No results for input(s): NA, K, CL, CO2, GLUCOSE, BUN, CREATININE, CALCIUM, GFRNONAA,  GFRAA, PROT, ALBUMIN, AST, ALT, ALKPHOS, BILITOT, BILIDIR, IBILI in the last 8760 hours.  RADIOGRAPHIC STUDIES: I have personally reviewed the radiological images as listed and agreed with the findings in the report. No results found.  ASSESSMENT & PLAN:   Other pancytopenia ([place]) #White count 2.6 ANC 300 hemoglobin 12.9 MCV 88 platelets 79.-Question primary bone marrow process [MDS versus myelofibrosis/hairy cell] versus other causes.  Given his age-high risk for MDS.  #Question B12 deficiency given recent low B12; however currently on supplementation.  Also check CBC CMP review of smear reticulocyte count multiple myeloma panel LDH viral studies including HIV hepatitis B and C.  #Question splenomegaly on exam-check ultrasound of abdomen complete ASAP.  Patient/wife not too keen on aggressive workup.  #Discussed with the patient and family regarding possible need for possible bone marrow biopsy-if the above initial workup is negative.  Thank you Dr.Klein for allowing me to participate in the care of your pleasant patient. Please do not hesitate to  contact me with questions or concerns in the interim.   # [phone]/home/wife-we will call with results; follow-up to be decided.  Cc; Dr.Klein.      All questions were answered. The patient knows to call the clinic with any problems, questions or concerns.       Cammie Sickle, MD 09/10/2017 4:30 PM

## 2017-09-10 NOTE — Assessment & Plan Note (Addendum)
#  White count 2.6 ANC 300 hemoglobin 12.9 MCV 88 platelets 79.-Question primary bone marrow process [MDS versus myelofibrosis/hairy cell] versus other causes.  Given his age-high risk for MDS.  #Question B12 deficiency given recent low B12; however currently on supplementation.  Also check CBC CMP review of smear reticulocyte count multiple myeloma panel LDH viral studies including HIV hepatitis B and C.  #Question splenomegaly on exam-check ultrasound of abdomen complete ASAP.  Patient/wife not too keen on aggressive workup.  #Discussed with the patient and family regarding possible need for possible bone marrow biopsy-if the above initial workup is negative.  Thank you Dr.Klein for allowing me to participate in the care of your pleasant patient. Please do not hesitate to contact me with questions or concerns in the interim.   # [phone]/home/wife-we will call with results; follow-up to be decided.  Cc; Dr.Klein.   Addendum: White count 1.8 ANC 400 hemoglobin 12.5 platelets 84; highly suspicious of a primary bone marrow process. Discussed with Dr. Caryl Comes regarding possible need for bone marrow biopsy.  Will return to the family/patient when more results available.

## 2017-09-11 LAB — HEPATITIS C ANTIBODY: HCV AB: 0.1 {s_co_ratio} (ref 0.0–0.9)

## 2017-09-11 LAB — PATHOLOGIST SMEAR REVIEW

## 2017-09-11 LAB — HEPATITIS B SURFACE ANTIGEN: HEP B S AG: NEGATIVE

## 2017-09-11 LAB — KAPPA/LAMBDA LIGHT CHAINS
Kappa free light chain: 48.1 mg/L — ABNORMAL HIGH (ref 3.3–19.4)
Kappa, lambda light chain ratio: 1.96 — ABNORMAL HIGH (ref 0.26–1.65)
Lambda free light chains: 24.5 mg/L (ref 5.7–26.3)

## 2017-09-11 LAB — HEPATITIS B CORE ANTIBODY, IGM: Hep B C IgM: NEGATIVE

## 2017-09-11 LAB — HIV ANTIBODY (ROUTINE TESTING W REFLEX): HIV Screen 4th Generation wRfx: NONREACTIVE

## 2017-09-12 ENCOUNTER — Telehealth: Payer: Self-pay | Admitting: Internal Medicine

## 2017-09-12 DIAGNOSIS — D61818 Other pancytopenia: Secondary | ICD-10-CM

## 2017-09-12 LAB — MULTIPLE MYELOMA PANEL, SERUM
ALPHA2 GLOB SERPL ELPH-MCNC: 0.4 g/dL (ref 0.4–1.0)
Albumin SerPl Elph-Mcnc: 3.7 g/dL (ref 2.9–4.4)
Albumin/Glob SerPl: 1.5 (ref 0.7–1.7)
Alpha 1: 0.2 g/dL (ref 0.0–0.4)
B-GLOBULIN SERPL ELPH-MCNC: 0.8 g/dL (ref 0.7–1.3)
Gamma Glob SerPl Elph-Mcnc: 1.1 g/dL (ref 0.4–1.8)
Globulin, Total: 2.6 g/dL (ref 2.2–3.9)
IGG (IMMUNOGLOBIN G), SERUM: 1045 mg/dL (ref 700–1600)
IGM (IMMUNOGLOBULIN M), SRM: 221 mg/dL — AB (ref 15–143)
IgA: 230 mg/dL (ref 61–437)
Total Protein ELP: 6.3 g/dL (ref 6.0–8.5)

## 2017-09-12 NOTE — Telephone Encounter (Signed)
Per patient's wife, patient is not able to come at 9:15. They depend on their daughter for transportation. They would like a pm apt if possible. I reviewed the schedule. I told the pt's wife that the could arrive at 2pm.for labs and see Dr. Jacinto Reap at 2:15pm.  Eldridge Abrahams, please arrange on schedule.

## 2017-09-12 NOTE — Telephone Encounter (Signed)
Please have the patient follow-up with me on March 1 at 9:15; lab appt;  to review the results of his Ultrasound/lab work.  Thx

## 2017-09-13 ENCOUNTER — Ambulatory Visit
Admission: RE | Admit: 2017-09-13 | Discharge: 2017-09-13 | Disposition: A | Discharge status: Home / Self Care | Payer: Medicare Other | Source: Ambulatory Visit | Attending: Internal Medicine | Admitting: Internal Medicine

## 2017-09-13 DIAGNOSIS — R161 Splenomegaly, not elsewhere classified: Secondary | ICD-10-CM | POA: Diagnosis present

## 2017-09-13 DIAGNOSIS — K8020 Calculus of gallbladder without cholecystitis without obstruction: Secondary | ICD-10-CM | POA: Insufficient documentation

## 2017-09-13 DIAGNOSIS — R160 Hepatomegaly, not elsewhere classified: Secondary | ICD-10-CM | POA: Insufficient documentation

## 2017-09-13 DIAGNOSIS — R140 Abdominal distension (gaseous): Secondary | ICD-10-CM | POA: Insufficient documentation

## 2017-09-13 DIAGNOSIS — R1905 Periumbilic swelling, mass or lump: Secondary | ICD-10-CM | POA: Diagnosis not present

## 2017-09-13 DIAGNOSIS — N281 Cyst of kidney, acquired: Secondary | ICD-10-CM | POA: Insufficient documentation

## 2017-09-13 DIAGNOSIS — D61818 Other pancytopenia: Secondary | ICD-10-CM

## 2017-09-13 LAB — METHYLMALONIC ACID, SERUM: METHYLMALONIC ACID, QUANTITATIVE: 101 nmol/L (ref 0–378)

## 2017-09-14 ENCOUNTER — Encounter: Payer: Self-pay | Admitting: Internal Medicine

## 2017-09-14 ENCOUNTER — Inpatient Hospital Stay (HOSPITAL_BASED_OUTPATIENT_CLINIC_OR_DEPARTMENT_OTHER): Payer: Medicare Other | Admitting: Internal Medicine

## 2017-09-14 ENCOUNTER — Inpatient Hospital Stay: Payer: Medicare Other | Attending: Internal Medicine

## 2017-09-14 VITALS — BP 148/92 | HR 64 | Temp 98.1°F | Resp 16 | Wt 160.0 lb

## 2017-09-14 DIAGNOSIS — N281 Cyst of kidney, acquired: Secondary | ICD-10-CM

## 2017-09-14 DIAGNOSIS — K219 Gastro-esophageal reflux disease without esophagitis: Secondary | ICD-10-CM | POA: Diagnosis not present

## 2017-09-14 DIAGNOSIS — D61818 Other pancytopenia: Secondary | ICD-10-CM | POA: Diagnosis not present

## 2017-09-14 DIAGNOSIS — I10 Essential (primary) hypertension: Secondary | ICD-10-CM | POA: Insufficient documentation

## 2017-09-14 DIAGNOSIS — K449 Diaphragmatic hernia without obstruction or gangrene: Secondary | ICD-10-CM | POA: Insufficient documentation

## 2017-09-14 DIAGNOSIS — R160 Hepatomegaly, not elsewhere classified: Secondary | ICD-10-CM | POA: Diagnosis not present

## 2017-09-14 DIAGNOSIS — Z79899 Other long term (current) drug therapy: Secondary | ICD-10-CM

## 2017-09-14 DIAGNOSIS — C8593 Non-Hodgkin lymphoma, unspecified, intra-abdominal lymph nodes: Secondary | ICD-10-CM

## 2017-09-14 DIAGNOSIS — E039 Hypothyroidism, unspecified: Secondary | ICD-10-CM | POA: Diagnosis not present

## 2017-09-14 DIAGNOSIS — F0390 Unspecified dementia without behavioral disturbance: Secondary | ICD-10-CM | POA: Insufficient documentation

## 2017-09-14 DIAGNOSIS — K8020 Calculus of gallbladder without cholecystitis without obstruction: Secondary | ICD-10-CM

## 2017-09-14 DIAGNOSIS — Z7982 Long term (current) use of aspirin: Secondary | ICD-10-CM | POA: Insufficient documentation

## 2017-09-14 DIAGNOSIS — Z87891 Personal history of nicotine dependence: Secondary | ICD-10-CM | POA: Diagnosis not present

## 2017-09-14 DIAGNOSIS — N400 Enlarged prostate without lower urinary tract symptoms: Secondary | ICD-10-CM | POA: Insufficient documentation

## 2017-09-14 NOTE — Progress Notes (Signed)
[place] NOTE  Patient Care Team: Adin Hector, MD as PCP - General (Internal Medicine)  CHIEF COMPLAINTS/PURPOSE OF CONSULTATION:  pancytopenia  #PANCYTOPENIA- [facility] 2019]-white count 2.5; ANC 300; hemoglobin 12.5; platelets 70s-90s  #Moderate dementia [as per family]; Jan 2019-B12 deficiency.   #Noninvasive bladder cancer status post resection [2017]; moderate dementia   No history exists.     HISTORY OF PRESENTING ILLNESS: Patient is a poor historian given his dementia; accompanied by his son and wife.  HELIO LACK 84 y.o.  male moderate dementia; a new diagnosis of pancytopenia-is here to review the results of his abdominal ultrasound.  Patient denies any fevers or chills.  Patient does complain of poor appetite.  Complains of extreme fatigue  No swelling in the legs.  No lumps or bumps.  Patient denies any easy bruising or gum bleeding or nosebleeds.  ROS: Is difficult to assess given patient's dementia.  MEDICAL HISTORY:  Past Medical History:  Diagnosis Date  . Bladder cancer ([place])   . BPH (benign prostatic hypertrophy)   . H/O hiatal hernia   . History of gastroesophageal reflux (GERD)    PT DENIES  . History of melanoma excision   . Hypertension   . Hypothyroidism   . Iron deficiency anemia     SURGICAL HISTORY: Past Surgical History:  Procedure Laterality Date  . CYSTOSCOPY W/ RETROGRADES Bilateral 12/04/2012   Procedure: CYSTOSCOPY WITH RETROGRADE PYELOGRAM, right stent;  Surgeon: Alexis Frock, MD;  Location: [facility];  Service: Urology;  Laterality: Bilateral;  . CYSTOSCOPY W/ RETROGRADES Bilateral 01/29/2013   Procedure: CYSTOSCOPY WITH RETROGRADE PYELOGRAM, BILATERAL with insertion right stent;  Surgeon: Alexis Frock, MD;  Location: [facility];  Service: Urology;  Laterality: Bilateral;  . ESOPHAGOGASTRODUODENOSCOPY ENDOSCOPY     ESOPHAGEAL DILATION  . INGUINAL HERNIA REPAIR Bilateral   . MELANOMA  EXCISION     SHOULDER AND WRIST  . TRANSURETHRAL RESECTION OF BLADDER TUMOR N/A 12/04/2012   Procedure: TRANSURETHRAL RESECTION OF BLADDER TUMOR (TURBT);  Surgeon: Alexis Frock, MD;  Location: [facility];  Service: Urology;  Laterality: N/A;  . TRANSURETHRAL RESECTION OF BLADDER TUMOR N/A 01/29/2013   Procedure: TRANSURETHRAL RESECTION OF BLADDER TUMOR (TURBT) RESTAGING TURBT;  Surgeon: Alexis Frock, MD;  Location: [facility];  Service: Urology;  Laterality: N/A;  rad tech/c-arm  . TRANSURETHRAL RESECTION OF PROSTATE N/A 12/04/2012   Procedure: TRANSURETHRAL RESECTION OF THE PROSTATE WITH GYRUS INSTRUMENTS;  Surgeon: Alexis Frock, MD;  Location: [facility];  Service: Urology;  Laterality: N/A;    SOCIAL HISTORY: quit smoking 55 years ago; no alcohol; in . Worked on [employer].  Social History   Socioeconomic History  . Marital status: Married    Spouse name: Not on file  . Number of children: Not on file  . Years of education: Not on file  . Highest education level: Not on file  Social Needs  . Financial resource strain: Not on file  . Food insecurity - worry: Not on file  . Food insecurity - inability: Not on file  . Transportation needs - medical: Not on file  . Transportation needs - non-medical: Not on file  Occupational History  . Not on file  Tobacco Use  . Smoking status: Never Smoker  . Smokeless tobacco: Never Used  Substance and Sexual Activity  . Alcohol use: No  . Drug use: No  . Sexual activity: Not on file  Other Topics Concern  . Not on file  Social History Narrative  . Not on file    FAMILY HISTORY: Family History  Problem Relation Age of Onset  . Dementia Mother     ALLERGIES:  is allergic to aspirin; other; and sulfa antibiotics.  MEDICATIONS:  Current Outpatient Medications  Medication Sig Dispense Refill  . amLODipine (NORVASC) 5 MG tablet Take 10 mg by mouth daily before breakfast.     . aspirin EC 81 MG tablet Take 1 tablet  (81 mg total) by mouth daily. 1 tablet 0  . cholecalciferol (VITAMIN D) 1000 UNITS tablet Take 1,000 Units by mouth daily.    . ferrous sulfate 325 (65 FE) MG tablet Take 325 mg by mouth daily with breakfast.    . finasteride (PROSCAR) 5 MG tablet Take 5 mg by mouth daily.    Marland Kitchen levothyroxine (SYNTHROID, LEVOTHROID) 25 MCG tablet Take 25 mcg by mouth daily before breakfast.     No current facility-administered medications for this visit.       Marland Kitchen  PHYSICAL EXAMINATION: ECOG PERFORMANCE STATUS: 0 - Asymptomatic  Vitals:   09/14/17 1429  BP: (!) 148/92  Pulse: 64  Resp: 16  Temp: 98.1 F (36.7 C)   Filed Weights   09/14/17 1429  Weight: 160 lb (72.6 kg)    GENERAL: Well-nourished well-developed; Alert, no distress and comfortable.   Accompanied by his wife/son.  EYES: no pallor or icterus OROPHARYNX: no thrush or ulceration; good dentition  NECK: supple, no masses felt LYMPH:  no palpable lymphadenopathy in the cervical, axillary or inguinal regions LUNGS: clear to auscultation and  No wheeze or crackles HEART/CVS: regular rate & rhythm and no murmurs; No lower extremity edema ABDOMEN: abdomen soft, non-tender and normal bowel sounds Musculoskeletal:no cyanosis of digits and no clubbing  PSYCH: alert & oriented x 3 with fluent speech NEURO: no focal motor/sensory deficits SKIN:  no rashes or significant lesions  LABORATORY DATA:  I have reviewed the data as listed Lab Results  Component Value Date   WBC 1.8 (L) 09/10/2017   HGB 12.7 (L) 09/10/2017   HCT 35.2 (L) 09/10/2017   MCV 84.4 09/10/2017   PLT 84 (L) 09/10/2017   Recent Labs    09/10/17 1435  NA 136  K 3.7  CL 101  CO2 26  GLUCOSE 99  BUN 13  CREATININE 0.84  CALCIUM 8.3*  GFRNONAA >60  GFRAA >60  PROT 6.8  ALBUMIN 3.8  AST 40  ALT 37  ALKPHOS 159*  BILITOT 1.0    RADIOGRAPHIC STUDIES: I have personally reviewed the radiological images as listed and agreed with the findings in the  report. US Abdomen Complete  Result Date: 09/13/2017 CLINICAL DATA:  Pancytopenia. EXAM: ABDOMEN ULTRASOUND COMPLETE COMPARISON:  CT abdomen and pelvis November 08, 2012 FINDINGS: Gallbladder: The gallbladder is contracted and filled with calculi. No pericholecystic fluid evident. No sonographic Murphy sign noted by sonographer. Common bile duct: Diameter: 4 mm. No intrahepatic, common hepatic, or common bile duct dilatation Liver: There is an inhomogeneous echotexture solid mass arising in the anterior segment of the right lobe of the liver measuring 4.2 x 6.0 x 3.9 cm. There is an adjacent complex mass in the anterior segment right lobe of the liver with cystic areas as well as solid areas measuring 2.3 x 2.4 cm. Liver echogenicity overall is within normal limits. Portal vein is patent on color Doppler imaging with normal direction of blood flow towards the liver. IVC: No abnormality visualized. Pancreas: Visualized portion unremarkable. Portions of pancreas  obscured by gas. Spleen: Size and appearance within normal limits. Right Kidney: Length: 10.2 cm. Echogenicity within normal limits. No mass or hydronephrosis visualized. Left Kidney: Length: 11.1 cm. Echogenicity within normal limits. No hydronephrosis visualized. There is a cyst arising from of the mid left kidney measuring 2.9 x 2.9 x 3.2 cm. Abdominal aorta: No aneurysm visualized. Other findings: No appreciable ascites. There is a mass to the left of midline at the level of the umbilicus measuring 5.8 x 3.8 x 5.1 cm. This mass is hypoechoic with increased through transmission. There is vascular flow in this lesion indicating that it does not represent a cyst. IMPRESSION: 1. Mass in the left mid abdomen at the level of the umbilicus. This mass is noncystic with vascular flow although uniformly hypoechoic with increased through transmission. This mass could represent an enlarged lymph node or a mass arising from the mesentery. Note that there was a  mesenteric mass in this area in 2014. This mass currently by ultrasound measures 5.8 x 3.8 x 5.1 cm. This mass may be equivocally larger than on previous study. It may be prudent to consider abdominal CT, ideally with intravenous contrast, to directly compare this current lesion with a previous lesion. 2. Liver masses are evident, inhomogeneous in echotexture. They do not represent typical hemangiomas. Note that the mass lesions were noted in the liver on prior study. Again, direct comparison with CT with intravenous contrast may be helpful to assess for stability. 3. Gallbladder contracted and filled with calculi. No pericholecystic fluid. 4. Portions of pancreas obscured by gas. Visualized portions of pancreas appear unremarkable. 5.  Cyst arising from the mid left kidney. 6. No splenic lesions evident. Spleen within normal limits with respect to size with normal contour. Electronically Signed   By: Lowella Grip III M.D.   On: 09/13/2017 09:11   IMPRESSION: 1. Mass in the left mid abdomen at the level of the umbilicus. This mass is noncystic with vascular flow although uniformly hypoechoic with increased through transmission. This mass could represent an enlarged lymph node or a mass arising from the mesentery. Note that there was a mesenteric mass in this area in 2014. This mass currently by ultrasound measures 5.8 x 3.8 x 5.1 cm. This mass may be equivocally larger than on previous study. It may be prudent to consider abdominal CT, ideally with intravenous contrast, to directly compare this current lesion with a previous lesion.  2. Liver masses are evident, inhomogeneous in echotexture. They do not represent typical hemangiomas. Note that the mass lesions were noted in the liver on prior study. Again, direct comparison with CT with intravenous contrast may be helpful to assess for stability.  3. Gallbladder contracted and filled with calculi. No pericholecystic fluid.  4. Portions  of pancreas obscured by gas. Visualized portions of pancreas appear unremarkable.  5.  Cyst arising from the mid left kidney.  6. No splenic lesions evident. Spleen within normal limits with respect to size with normal contour.   Electronically Signed   By: Lowella Grip III M.D.   On: 09/13/2017 09:11   ASSESSMENT & PLAN:   Other pancytopenia ([place]) #White count 2.6 ANC 300 hemoglobin 12.9 MCV 88 platelets 79-fairly new onset in the last 2 years.  Ultrasound of the abdomen shows-abdominal mass/mesenteric mass liver lesions; however spleen is normal.  # Given the pancytopenia and the abnormal ultrasound suspicious for lymphoma.  Awaiting peripheral blood flow cytometry.  I would recommend a PET scan for further evaluation.  #Long  discussion with the patient and family regarding further workup which would also include biopsy.  However given patient's moderate dementia-family not interested in any aggressive measures which I think is reasonable.  #For now recommend PET scan/await peripheral blood flow cytometry-before making any final decisions regarding treatment.  .-Question primary bone marrow process vs lymphoma- based on ultrasound of ab-  # Bladder cancer non-invasive-no obvious evidence of any recurrence.  # Moderate dementia- ~ 2 years; question need for anti-dementia medication.  Follow up few days aftre the PET scan.  Discussed with the patient's son/and family in detail.     All questions were answered. The patient knows to call the clinic with any problems, questions or concerns.       Cammie Sickle, MD 09/15/2017 10:27 PM

## 2017-09-14 NOTE — Assessment & Plan Note (Addendum)
#  White count 2.6 ANC 300 hemoglobin 12.9 MCV 88 platelets 79-fairly new onset in the last 2 years.  Ultrasound of the abdomen shows-abdominal mass/mesenteric mass liver lesions; however spleen is normal.  # Given the pancytopenia and the abnormal ultrasound suspicious for lymphoma.  Awaiting peripheral blood flow cytometry.  I would recommend a PET scan for further evaluation.  #Long discussion with the patient and family regarding further workup which would also include biopsy.  However given patient's moderate dementia-family not interested in any aggressive measures which I think is reasonable.  #For now recommend PET scan/await peripheral blood flow cytometry-before making any final decisions regarding treatment.  .-Question primary bone marrow process vs lymphoma- based on ultrasound of ab-  # Bladder cancer non-invasive-no obvious evidence of any recurrence.  # Moderate dementia- ~ 2 years; question need for anti-dementia medication.  Follow up few days aftre the PET scan.  Discussed with the patient's son/and family in detail.

## 2017-09-17 LAB — COMP PANEL: LEUKEMIA/LYMPHOMA

## 2017-09-24 ENCOUNTER — Ambulatory Visit
Admission: RE | Admit: 2017-09-24 | Discharge: 2017-09-24 | Disposition: A | Discharge status: Home / Self Care | Payer: Medicare Other | Source: Ambulatory Visit | Attending: Internal Medicine | Admitting: Internal Medicine

## 2017-09-24 DIAGNOSIS — N433 Hydrocele, unspecified: Secondary | ICD-10-CM | POA: Diagnosis not present

## 2017-09-24 DIAGNOSIS — K5730 Diverticulosis of large intestine without perforation or abscess without bleeding: Secondary | ICD-10-CM | POA: Insufficient documentation

## 2017-09-24 DIAGNOSIS — I2510 Atherosclerotic heart disease of native coronary artery without angina pectoris: Secondary | ICD-10-CM | POA: Diagnosis not present

## 2017-09-24 DIAGNOSIS — R160 Hepatomegaly, not elsewhere classified: Secondary | ICD-10-CM | POA: Insufficient documentation

## 2017-09-24 DIAGNOSIS — C8593 Non-Hodgkin lymphoma, unspecified, intra-abdominal lymph nodes: Secondary | ICD-10-CM | POA: Insufficient documentation

## 2017-09-24 DIAGNOSIS — I700 Atherosclerosis of aorta: Secondary | ICD-10-CM | POA: Insufficient documentation

## 2017-09-24 DIAGNOSIS — J320 Chronic maxillary sinusitis: Secondary | ICD-10-CM | POA: Diagnosis not present

## 2017-09-24 DIAGNOSIS — N400 Enlarged prostate without lower urinary tract symptoms: Secondary | ICD-10-CM | POA: Diagnosis not present

## 2017-09-24 DIAGNOSIS — K449 Diaphragmatic hernia without obstruction or gangrene: Secondary | ICD-10-CM | POA: Diagnosis not present

## 2017-09-24 DIAGNOSIS — R918 Other nonspecific abnormal finding of lung field: Secondary | ICD-10-CM | POA: Diagnosis not present

## 2017-09-24 DIAGNOSIS — R1905 Periumbilic swelling, mass or lump: Secondary | ICD-10-CM | POA: Diagnosis not present

## 2017-09-24 DIAGNOSIS — D61818 Other pancytopenia: Secondary | ICD-10-CM | POA: Insufficient documentation

## 2017-09-24 LAB — GLUCOSE, CAPILLARY: Glucose-Capillary: 122 mg/dL — ABNORMAL HIGH (ref 65–99)

## 2017-09-24 MED ORDER — FLUDEOXYGLUCOSE F - 18 (FDG) INJECTION
8.4700 | Freq: Once | INTRAVENOUS | Status: AC | PRN
  Administered 2017-09-24: 8.47 via INTRAVENOUS

## 2017-09-25 ENCOUNTER — Telehealth: Payer: Self-pay | Admitting: Internal Medicine

## 2017-09-25 DIAGNOSIS — D61818 Other pancytopenia: Secondary | ICD-10-CM

## 2017-09-25 NOTE — Telephone Encounter (Signed)
Colette, please schedule lab/md on 3/14 at 845 am per Dr. Jacinto Reap

## 2017-09-25 NOTE — Telephone Encounter (Signed)
Left message for Dr.rubinas to discuss the path/flow.  Please schedule appt with me on 3/14 at 8:45; lab- with cbc with diff.

## 2017-09-25 NOTE — Addendum Note (Signed)
Addended by: Sabino Gasser on: 09/25/2017 04:13 PM   Modules accepted: Orders

## 2017-09-27 ENCOUNTER — Other Ambulatory Visit: Payer: Self-pay | Admitting: *Deleted

## 2017-09-27 ENCOUNTER — Telehealth: Payer: Self-pay

## 2017-09-27 ENCOUNTER — Inpatient Hospital Stay (HOSPITAL_BASED_OUTPATIENT_CLINIC_OR_DEPARTMENT_OTHER): Payer: Medicare Other | Admitting: Internal Medicine

## 2017-09-27 ENCOUNTER — Inpatient Hospital Stay: Payer: Medicare Other

## 2017-09-27 ENCOUNTER — Encounter: Payer: Self-pay | Admitting: Internal Medicine

## 2017-09-27 ENCOUNTER — Other Ambulatory Visit: Payer: Self-pay

## 2017-09-27 VITALS — BP 138/82 | HR 79 | Temp 98.1°F | Resp 18 | Ht 63.0 in | Wt 147.4 lb

## 2017-09-27 DIAGNOSIS — D61818 Other pancytopenia: Secondary | ICD-10-CM

## 2017-09-27 DIAGNOSIS — Z79899 Other long term (current) drug therapy: Secondary | ICD-10-CM

## 2017-09-27 DIAGNOSIS — K8020 Calculus of gallbladder without cholecystitis without obstruction: Secondary | ICD-10-CM

## 2017-09-27 DIAGNOSIS — R160 Hepatomegaly, not elsewhere classified: Secondary | ICD-10-CM

## 2017-09-27 DIAGNOSIS — K668 Other specified disorders of peritoneum: Secondary | ICD-10-CM

## 2017-09-27 DIAGNOSIS — N281 Cyst of kidney, acquired: Secondary | ICD-10-CM | POA: Diagnosis not present

## 2017-09-27 LAB — CBC WITH DIFFERENTIAL/PLATELET
BASOS ABS: 0 10*3/uL (ref 0–0.1)
Basophils Relative: 1 %
EOS ABS: 0 10*3/uL (ref 0–0.7)
EOS PCT: 0 %
HCT: 34.4 % — ABNORMAL LOW (ref 40.0–52.0)
Hemoglobin: 12.3 g/dL — ABNORMAL LOW (ref 13.0–18.0)
Lymphocytes Relative: 84 %
Lymphs Abs: 1.5 10*3/uL (ref 1.0–3.6)
MCH: 31.3 pg (ref 26.0–34.0)
MCHC: 35.8 g/dL (ref 32.0–36.0)
MCV: 87.5 fL (ref 80.0–100.0)
Monocytes Absolute: 0.1 10*3/uL — ABNORMAL LOW (ref 0.2–1.0)
Monocytes Relative: 7 %
Neutro Abs: 0.1 10*3/uL — ABNORMAL LOW (ref 1.4–6.5)
Neutrophils Relative %: 8 %
Platelets: 48 10*3/uL — ABNORMAL LOW (ref 150–440)
RBC: 3.93 MIL/uL — ABNORMAL LOW (ref 4.40–5.90)
RDW: 16.6 % — ABNORMAL HIGH (ref 11.5–14.5)
WBC: 1.8 10*3/uL — AB (ref 3.8–10.6)

## 2017-09-27 NOTE — Assessment & Plan Note (Addendum)
#  White count 2.6 ANC 300 hemoglobin 12.9 MCV 88 platelets 79-fairly new onset in the last 2 years.  Platelet count-continue to trend down/most recent 48.  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  #Patient's case reviewed at the tumor conference-it was felt that bone marrow biopsy/omental mass biopsy would be recommended for further evaluation.  #Long discussion with the family/son- regarding further workup which would also include biopsy.  However given patient's moderate dementia-family not interested in any aggressive measures which I think is reasonable.  However they understand that biopsy would be diagnostic/and if patient has lymphoma/leukemia-would have treatment options like ibrutinib.   # Moderate dementia- ~ 2 years; question need for anti-dementia medication; hallucinations questions delirium.  Defer to PCP.  Addendum: After discussion the tumor conference patient's son was updated of the recommendations; they agree.  Patient will follow up with me 3-4 days after the results of the biopsy all.  # [id number] Scotty Nehring [son]

## 2017-09-27 NOTE — Progress Notes (Signed)
Patient here for follow-up for results.

## 2017-09-27 NOTE — Telephone Encounter (Signed)
I contacted patient's son, Scotty - and notified him that patient's case was discussed at tumor conference today.  Per Dr. Jacinto Reap, it is recommended that patient proceed with bone marrow biopsy, and a biopsy of mass in abdomen. I notified patient's son of these recommendations, and he verbalized understanding. I also notified him that Dr. B would be personally calling him in the morning to discuss this information further.  He thanked me for calling.   Bone marrow biopsy forms and orders to be completed.

## 2017-09-27 NOTE — Progress Notes (Signed)
Matthew Walters NOTE  Patient Care Team: Matthew Hector, MD as PCP - General (Internal Medicine)  CHIEF COMPLAINTS/PURPOSE OF CONSULTATION:  pancytopenia  #PANCYTOPENIA- Matthew Walters 2019]-white count 2.5; ANC 300; hemoglobin 12.5; platelets 70s-90s  #Moderate dementia [as per family]; Jan 2019-B12 deficiency.   #Noninvasive bladder cancer status post resection [2017]; moderate dementia   No history exists.     HISTORY OF PRESENTING ILLNESS: Patient is a poor historian given his dementia; accompanied by his son and wife.  Matthew Walters 84 y.o.  male moderate dementia; a new diagnosis of pancytopenia-is here to review the results of his PET scan.  Patient denies any fevers or chills.  Patient does complain of poor appetite.  As per the family patient has extreme fatigue; sleeping/resting most of the time.  No swelling in the legs.  No lumps or bumps.  Patient denies any easy bruising or gum bleeding or nosebleeds.  As per the family patient -has frequent episodes of hallucinations/delirium more so in the last 6 months or so.  ROS: Is difficult to assess given patient's dementia.  MEDICAL HISTORY:  Past Medical History:  Diagnosis Date  . Bladder cancer ([place])   . BPH (benign prostatic hypertrophy)   . H/O hiatal hernia   . History of gastroesophageal reflux (GERD)    PT DENIES  . History of melanoma excision   . Hypertension   . Hypothyroidism   . Iron deficiency anemia     SURGICAL HISTORY: Past Surgical History:  Procedure Laterality Date  . CYSTOSCOPY W/ RETROGRADES Bilateral 12/04/2012   Procedure: CYSTOSCOPY WITH RETROGRADE PYELOGRAM, right stent;  Surgeon: Matthew Frock, MD;  Location: [facility];  Service: Urology;  Laterality: Bilateral;  . CYSTOSCOPY W/ RETROGRADES Bilateral 01/29/2013   Procedure: CYSTOSCOPY WITH RETROGRADE PYELOGRAM, BILATERAL with insertion right stent;  Surgeon: Matthew Frock, MD;  Location: [facility];   Service: Urology;  Laterality: Bilateral;  . ESOPHAGOGASTRODUODENOSCOPY ENDOSCOPY     ESOPHAGEAL DILATION  . INGUINAL HERNIA REPAIR Bilateral   . MELANOMA EXCISION     SHOULDER AND WRIST  . TRANSURETHRAL RESECTION OF BLADDER TUMOR N/A 12/04/2012   Procedure: TRANSURETHRAL RESECTION OF BLADDER TUMOR (TURBT);  Surgeon: Matthew Frock, MD;  Location: [facility];  Service: Urology;  Laterality: N/A;  . TRANSURETHRAL RESECTION OF BLADDER TUMOR N/A 01/29/2013   Procedure: TRANSURETHRAL RESECTION OF BLADDER TUMOR (TURBT) RESTAGING TURBT;  Surgeon: Matthew Frock, MD;  Location: [facility];  Service: Urology;  Laterality: N/A;  rad tech/c-arm  . TRANSURETHRAL RESECTION OF PROSTATE N/A 12/04/2012   Procedure: TRANSURETHRAL RESECTION OF THE PROSTATE WITH GYRUS INSTRUMENTS;  Surgeon: Matthew Frock, MD;  Location: [facility];  Service: Urology;  Laterality: N/A;    SOCIAL HISTORY: quit smoking 55 years ago; no alcohol; in [place]. Worked on [employer].  Social History   Socioeconomic History  . Marital status: Married    Spouse name: Not on file  . Number of children: Not on file  . Years of education: Not on file  . Highest education level: Not on file  Social Needs  . Financial resource strain: Not on file  . Food insecurity - worry: Not on file  . Food insecurity - inability: Not on file  . Transportation needs - medical: Not on file  . Transportation needs - non-medical: Not on file  Occupational History  . Not on file  Tobacco Use  . Smoking status: Never Smoker  . Smokeless tobacco: Never Used  Substance  and Sexual Activity  . Alcohol use: No  . Drug use: No  . Sexual activity: Not on file  Other Topics Concern  . Not on file  Social History Narrative  . Not on file    FAMILY HISTORY: Family History  Problem Relation Age of Onset  . Dementia Mother     ALLERGIES:  is allergic to aspirin; other; and sulfa antibiotics.  MEDICATIONS:  Current Outpatient Medications   Medication Sig Dispense Refill  . amLODipine (NORVASC) 5 MG tablet Take 10 mg by mouth daily before breakfast.     . aspirin EC 81 MG tablet Take 1 tablet (81 mg total) by mouth daily. 1 tablet 0  . cholecalciferol (VITAMIN D) 1000 UNITS tablet Take 1,000 Units by mouth daily.    . Cyanocobalamin (B-12) 1000 MCG SUBL Place 1 tablet under the tongue daily.    . ferrous sulfate 325 (65 FE) MG tablet Take 325 mg by mouth daily with breakfast.    . finasteride (PROSCAR) 5 MG tablet Take 5 mg by mouth daily.    Marland Kitchen levothyroxine (SYNTHROID, LEVOTHROID) 25 MCG tablet Take 25 mcg by mouth daily before breakfast.     No current facility-administered medications for this visit.       Marland Kitchen  PHYSICAL EXAMINATION: ECOG PERFORMANCE STATUS: 0 - Asymptomatic  Vitals:   09/27/17 0845  BP: 138/82  Pulse: 79  Resp: 18  Temp: 98.1 F (36.7 C)   Filed Weights   09/27/17 0853  Weight: 147 lb 6.4 oz (66.9 kg)    GENERAL: Well-nourished well-developed; Alert, no distress and comfortable.   Accompanied by his wife/son/daughter. EYES: no pallor or icterus OROPHARYNX: no thrush or ulceration; good dentition  NECK: supple, no masses felt LYMPH:  no palpable lymphadenopathy in the cervical, axillary or inguinal regions LUNGS: clear to auscultation and  No wheeze or crackles HEART/CVS: regular rate & rhythm and no murmurs; No lower extremity edema ABDOMEN: abdomen soft, non-tender and normal bowel sounds; positive for abdominal mass. Musculoskeletal:no cyanosis of digits and no clubbing  PSYCH: alert & oriented x 3 with fluent speech NEURO: no focal motor/sensory deficits SKIN:  no rashes or significant lesions  LABORATORY DATA:  I have reviewed the data as listed Lab Results  Component Value Date   WBC 1.8 (L) 09/27/2017   HGB 12.3 (L) 09/27/2017   HCT 34.4 (L) 09/27/2017   MCV 87.5 09/27/2017   PLT 48 (L) 09/27/2017   Recent Labs    09/10/17 1435  NA 136  K 3.7  CL 101  CO2 26  GLUCOSE  99  BUN 13  CREATININE 0.84  CALCIUM 8.3*  GFRNONAA >60  GFRAA >60  PROT 6.8  ALBUMIN 3.8  AST 40  ALT 37  ALKPHOS 159*  BILITOT 1.0    RADIOGRAPHIC STUDIES: I have personally reviewed the radiological images as listed and agreed with the findings in the report. US Abdomen Complete  Result Date: 09/13/2017 CLINICAL DATA:  Pancytopenia. EXAM: ABDOMEN ULTRASOUND COMPLETE COMPARISON:  CT abdomen and pelvis November 08, 2012 FINDINGS: Gallbladder: The gallbladder is contracted and filled with calculi. No pericholecystic fluid evident. No sonographic Murphy sign noted by sonographer. Common bile duct: Diameter: 4 mm. No intrahepatic, common hepatic, or common bile duct dilatation Liver: There is an inhomogeneous echotexture solid mass arising in the anterior segment of the right lobe of the liver measuring 4.2 x 6.0 x 3.9 cm. There is an adjacent complex mass in the anterior segment right lobe of  the liver with cystic areas as well as solid areas measuring 2.3 x 2.4 cm. Liver echogenicity overall is within normal limits. Portal vein is patent on color Doppler imaging with normal direction of blood flow towards the liver. IVC: No abnormality visualized. Pancreas: Visualized portion unremarkable. Portions of pancreas obscured by gas. Spleen: Size and appearance within normal limits. Right Kidney: Length: 10.2 cm. Echogenicity within normal limits. No mass or hydronephrosis visualized. Left Kidney: Length: 11.1 cm. Echogenicity within normal limits. No hydronephrosis visualized. There is a cyst arising from of the mid left kidney measuring 2.9 x 2.9 x 3.2 cm. Abdominal aorta: No aneurysm visualized. Other findings: No appreciable ascites. There is a mass to the left of midline at the level of the umbilicus measuring 5.8 x 3.8 x 5.1 cm. This mass is hypoechoic with increased through transmission. There is vascular flow in this lesion indicating that it does not represent a cyst. IMPRESSION: 1. Mass in the left  mid abdomen at the level of the umbilicus. This mass is noncystic with vascular flow although uniformly hypoechoic with increased through transmission. This mass could represent an enlarged lymph node or a mass arising from the mesentery. Note that there was a mesenteric mass in this area in 2014. This mass currently by ultrasound measures 5.8 x 3.8 x 5.1 cm. This mass may be equivocally larger than on previous study. It may be prudent to consider abdominal CT, ideally with intravenous contrast, to directly compare this current lesion with a previous lesion. 2. Liver masses are evident, inhomogeneous in echotexture. They do not represent typical hemangiomas. Note that the mass lesions were noted in the liver on prior study. Again, direct comparison with CT with intravenous contrast may be helpful to assess for stability. 3. Gallbladder contracted and filled with calculi. No pericholecystic fluid. 4. Portions of pancreas obscured by gas. Visualized portions of pancreas appear unremarkable. 5.  Cyst arising from the mid left kidney. 6. No splenic lesions evident. Spleen within normal limits with respect to size with normal contour. Electronically Signed   By: Lowella Grip III M.D.   On: 09/13/2017 09:11   Nm Pet Image Initial (pi) Skull Base To Thigh  Result Date: 09/24/2017 CLINICAL DATA:  Initial treatment strategy for pancytopenia with mid abdominal mass suspicious for malignancy. Liver masses. EXAM: NUCLEAR MEDICINE PET SKULL BASE TO THIGH TECHNIQUE: A 0.5 mCi F-18 FDG was injected intravenously. Full-ring PET imaging was performed from the skull base to thigh after the radiotracer. CT data was obtained and used for attenuation correction and anatomic localization. Fasting blood glucose: 122 mg/dl Mediastinal blood pool activity: SUV max 1.7 Liver blood pool activity: 2.8 COMPARISON:  Abdominal ultrasound 09/13/2017 FINDINGS: NECK: No significant abnormal hypermetabolic activity in the neck. Incidental CT  findings: Chronic maxillary sinusitis, right greater than left. CHEST: No significant abnormal hypermetabolic activity is identified in the chest. Incidental CT findings: Large hiatal hernia containing the stomach and part of the transverse colon. Coronary, aortic arch, and branch vessel atherosclerotic vascular disease. Mild ground-glass density anteriorly in the right upper lobe on image 76/3, maximum SUV 1.7, likely inflammatory but technically nonspecific. ABDOMEN/PELVIS: There is an omental mass just below the umbilicus and to the right of midline measuring approximately 5.1 by 3.9 by 5.7 cm (volume = 59 cm^3), which along its anterior margin has some faintly accentuated metabolic activity with maximum SUV 4.1. Much of the lesion has lesser activity with maximum SUV more in the 2.1 range. There are multiple hypodense liver  lesions, with 1 of the larger lesions in the right hepatic lobe measuring 5.4 by 3.9 cm on image 148/3, maximum SUV 2.6. A segment 7 lesion measuring approximately 2.6 by 2.1 cm on image 120/3 has a maximum SUV of approximately 2.1. Overall no appreciably hypermetabolic liver lesions are identified. Similarly, no focal splenic lesion is identified. The spleen measures 10.3 by 7.1 by 13.2 cm (volume = 510 cm^3). Incidental CT findings: Aortoiliac atherosclerotic vascular disease. Benign-appearing left kidney lower pole cyst. Prior lower abdominal hernia repairs. Sigmoid colon diverticulosis. Prostatomegaly. Bilateral scrotal hydroceles. SKELETON: Diffuse low-grade accentuated metabolic activity in a symmetric fashion. A representative measurement in the upper sacral (S1) region has a maximum SUV of 3.4. No focal bony lesions are identified. Incidental CT findings: none IMPRESSION: 1. Solid-appearing omental mass below the level of the umbilicus, nearly 60 cc in volume, with internal metabolic activity compatible with Deauville 4 activity. 2. Several solid liver masses are nonspecific and  demonstrate Deauville 3 activity. These could be further worked up with hepatic protocol MRI with and without contrast, if clinically warranted. 3. Diffuse low-grade accentuated metabolic activity in the skeleton without focal lesion identified. Low-grade marrow infiltration is not excluded based on this appearance. 4. Faint ground-glass opacity anteriorly in the right upper lobe with only low-grade activity, likely inflammatory but technically nonspecific. Strictly speaking, low-grade adenocarcinoma is not excluded, and surveillance chest CT in 6 months time should be considered. 5. Other imaging findings of potential clinical significance: Chronic maxillary sinusitis. Aortic Atherosclerosis (ICD10-I70.0). Coronary atherosclerosis. Sigmoid colon diverticulosis. Bilateral scrotal hydroceles. Prostatomegaly. Large hiatal hernia containing stomach and transverse colon. Electronically Signed   By: Van Clines M.D.   On: 09/24/2017 13:39   IMPRESSION: 1. Mass in the left mid abdomen at the level of the umbilicus. This mass is noncystic with vascular flow although uniformly hypoechoic with increased through transmission. This mass could represent an enlarged lymph node or a mass arising from the mesentery. Note that there was a mesenteric mass in this area in 2014. This mass currently by ultrasound measures 5.8 x 3.8 x 5.1 cm. This mass may be equivocally larger than on previous study. It may be prudent to consider abdominal CT, ideally with intravenous contrast, to directly compare this current lesion with a previous lesion.  2. Liver masses are evident, inhomogeneous in echotexture. They do not represent typical hemangiomas. Note that the mass lesions were noted in the liver on prior study. Again, direct comparison with CT with intravenous contrast may be helpful to assess for stability.  3. Gallbladder contracted and filled with calculi. No pericholecystic fluid.  4. Portions of pancreas  obscured by gas. Visualized portions of pancreas appear unremarkable.  5.  Cyst arising from the mid left kidney.  6. No splenic lesions evident. Spleen within normal limits with respect to size with normal contour.   Electronically Signed   By: Lowella Grip III M.D.   On: 09/13/2017 09:11   ASSESSMENT & PLAN:   Other pancytopenia ([place]) #White count 2.6 ANC 300 hemoglobin 12.9 MCV 88 platelets 79-fairly new onset in the last 2 years.  Platelet count-continue to trend down/most recent 48.  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  #Patient's case reviewed at the tumor conference-it was felt  that bone marrow biopsy/omental mass biopsy would be recommended for further evaluation.  #Long discussion with the family/son- regarding further workup which would also include biopsy.  However given patient's moderate dementia-family not interested in any aggressive measures which I think is reasonable.  However they understand that biopsy would be diagnostic/and if patient has lymphoma/leukemia-would have treatment options like ibrutinib.   # Moderate dementia- ~ 2 years; question need for anti-dementia medication; hallucinations questions delirium.  Defer to PCP.  Addendum: After discussion the tumor conference patient's son was updated of the recommendations; they agree.  Patient will follow up with me 3-4 days after the results of the biopsy all.  # [phone]/ Scotty Arreguin [son]    All questions were answered. The patient knows to call the clinic with any problems, questions or concerns.       Cammie Sickle, MD 09/30/2017 7:35 PM

## 2017-09-28 ENCOUNTER — Telehealth: Payer: Self-pay | Admitting: Internal Medicine

## 2017-09-28 NOTE — Telephone Encounter (Signed)
Spoke to pt's son, scotty- re: plan for bx; he agrees.he and his family very concerned about his dad's dementia/delirium; appt with PCP this week.   Please make follow up with me in apx 3 days after Biopsy; no labs.

## 2017-10-02 NOTE — Telephone Encounter (Signed)
Apts per md order have been arranged

## 2017-10-05 ENCOUNTER — Other Ambulatory Visit: Payer: Self-pay | Admitting: Internal Medicine

## 2017-10-05 DIAGNOSIS — F05 Delirium due to known physiological condition: Secondary | ICD-10-CM

## 2017-10-10 ENCOUNTER — Telehealth: Payer: Self-pay | Admitting: Internal Medicine

## 2017-10-10 NOTE — Telephone Encounter (Signed)
Thanks Heather.

## 2017-10-10 NOTE — Telephone Encounter (Signed)
Spoke with patient's son Nicki Reaper. Patient's family has made a decision to not to pursue any further work up or agressive treatment.  Patient's son voices concerns that the dementia is getting worse.  We did discuss options for palliative care vs hospice care. Son states that he would contact our office back once he has had an discussion with the pcp as well.  Patient is declining, more so from the dementia aspect per son.  Son will contact our office back if they need anything else.

## 2017-10-10 NOTE — Telephone Encounter (Signed)
They cancelled appt with me for 4/1. But can you please check if he is getting biopsy as planned for tomorrow?

## 2017-10-11 ENCOUNTER — Ambulatory Visit: Payer: Medicare Other

## 2017-10-11 ENCOUNTER — Ambulatory Visit: Admission: RE | Admit: 2017-10-11 | Payer: Medicare Other | Source: Ambulatory Visit

## 2017-10-15 ENCOUNTER — Ambulatory Visit: Payer: Medicare Other | Admitting: Internal Medicine

## 2017-10-22 ENCOUNTER — Ambulatory Visit
Admission: RE | Admit: 2017-10-22 | Discharge: 2017-10-22 | Disposition: A | Discharge status: Home / Self Care | Payer: Medicare Other | Source: Ambulatory Visit | Attending: Internal Medicine | Admitting: Internal Medicine

## 2017-10-22 DIAGNOSIS — R410 Disorientation, unspecified: Secondary | ICD-10-CM | POA: Diagnosis not present

## 2017-10-22 DIAGNOSIS — G319 Degenerative disease of nervous system, unspecified: Secondary | ICD-10-CM | POA: Insufficient documentation

## 2017-10-22 DIAGNOSIS — I739 Peripheral vascular disease, unspecified: Secondary | ICD-10-CM | POA: Insufficient documentation

## 2017-10-22 DIAGNOSIS — F05 Delirium due to known physiological condition: Secondary | ICD-10-CM

## 2017-12-15 DEATH — deceased

## 2018-05-27 IMAGING — US US ABDOMEN COMPLETE
1 series · 13 of 25 positions shown · non-contrast
Comparison: CT abdomen and pelvis November 08, 2012

CLINICAL DATA: Pancytopenia.

EXAM:
ABDOMEN ULTRASOUND COMPLETE

[Series 1: us abdomen complete · 0.14mm/px · 13 of 113 slices shown]
[im 1/113]
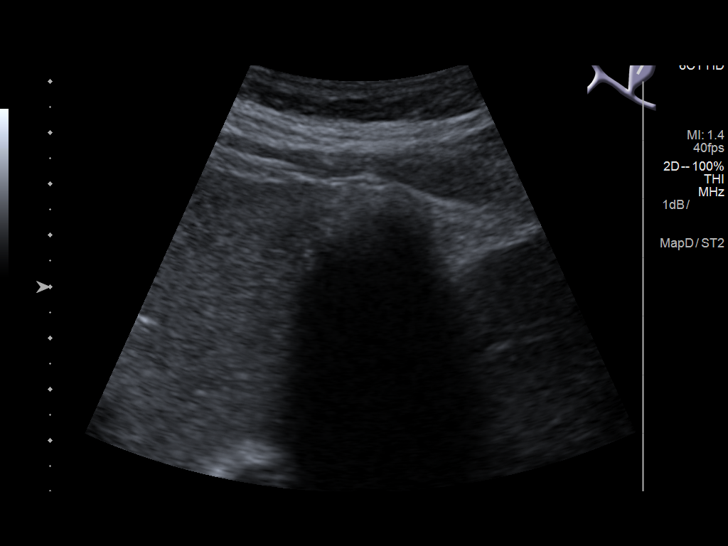
[im 10/113]
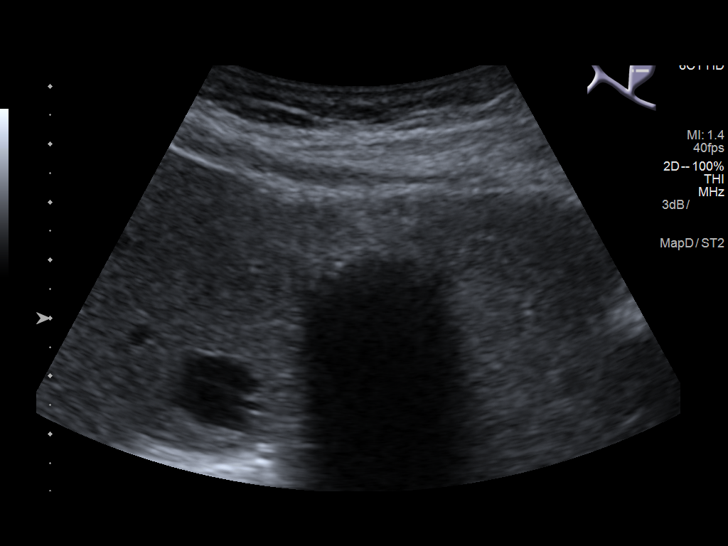
[im 19/113]
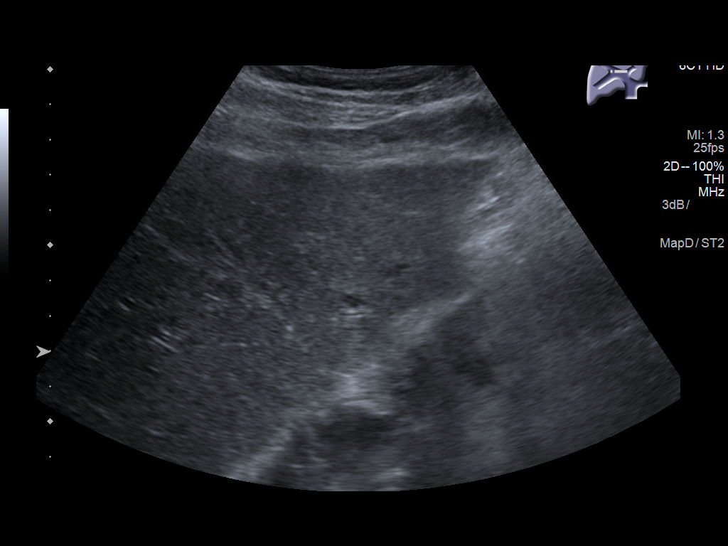
[im 29/113]
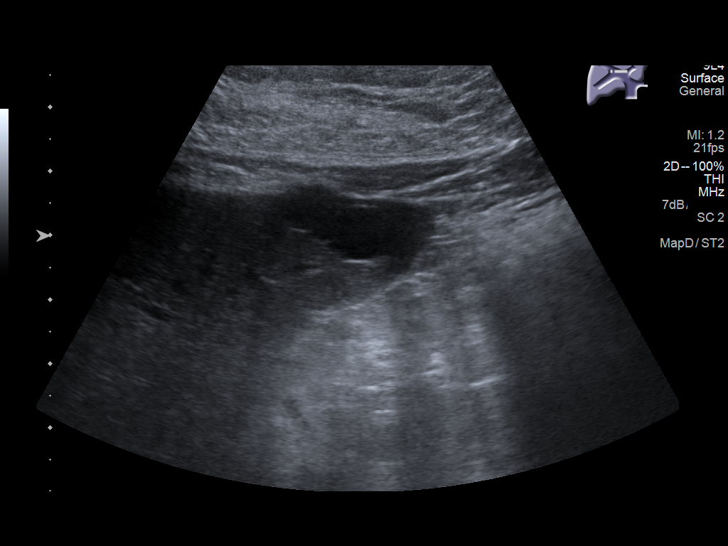
[im 38/113]
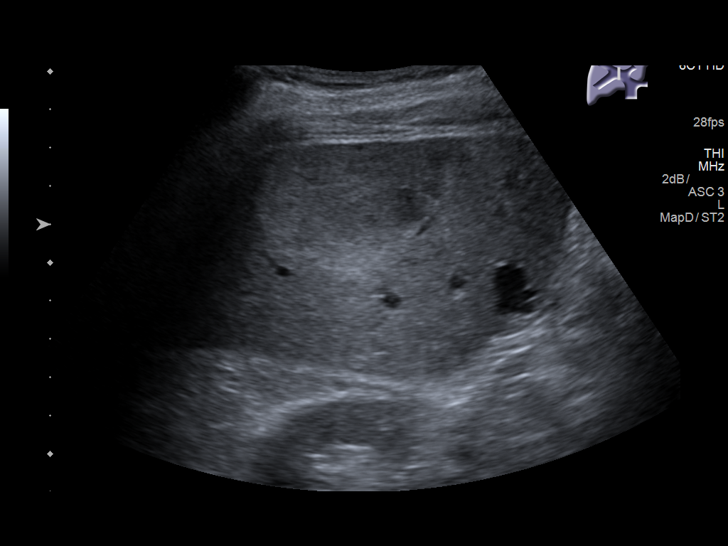
[im 47/113]
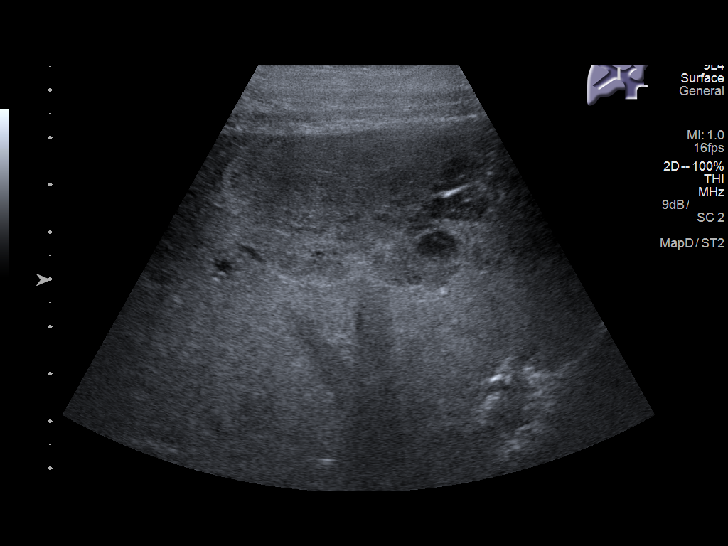
[im 57/113]
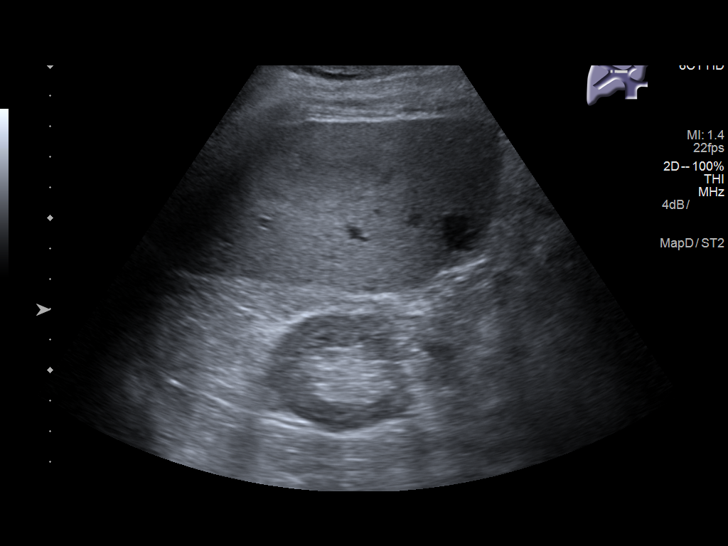
[im 66/113]
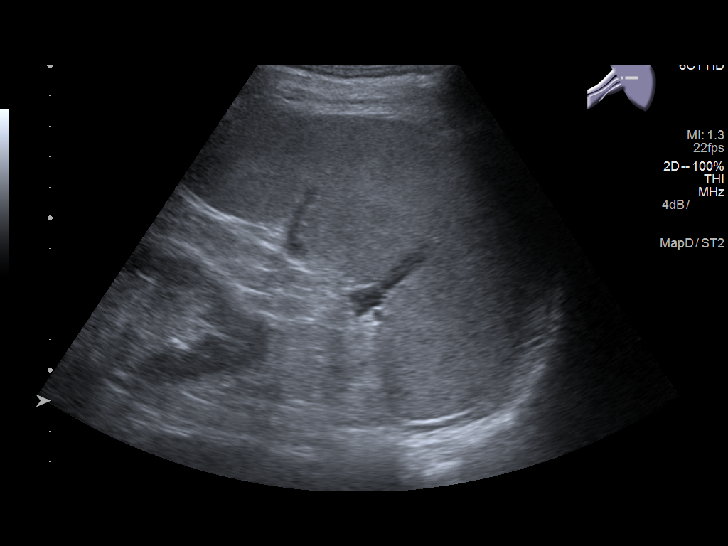
[im 75/113]
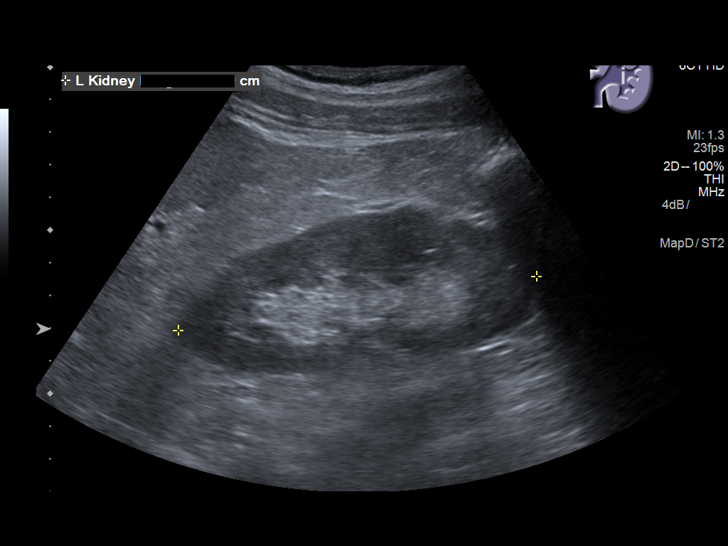
[im 85/113]
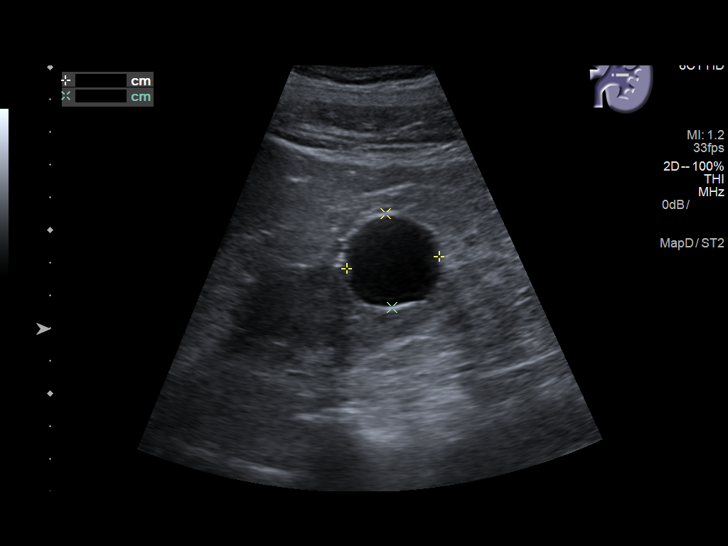
[im 94/113]
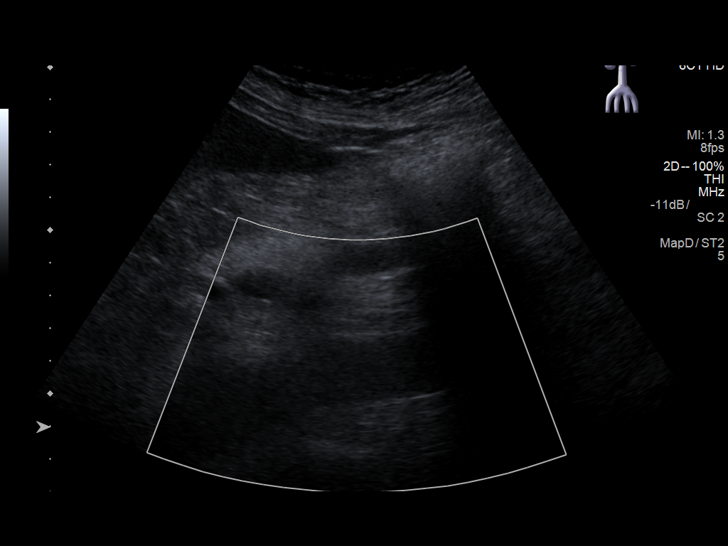
[im 103/113]
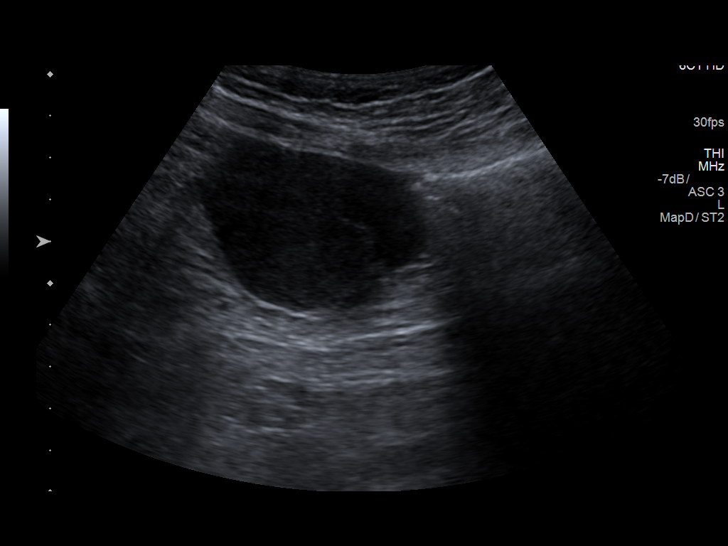
[im 113/113]
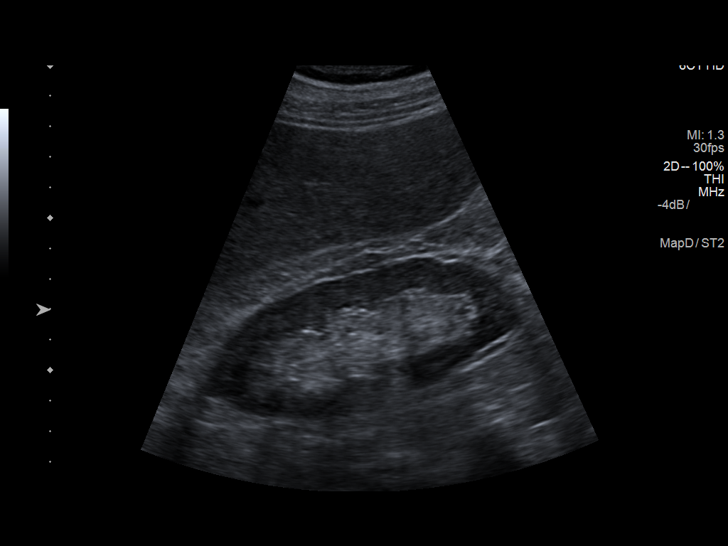

[13 of 25 positions shown; findings below may reference images not displayed]

FINDINGS: Gallbladder: The gallbladder is contracted and filled with calculi.
No pericholecystic fluid evident. No sonographic Murphy sign noted
by sonographer.

Common bile duct: Diameter: 4 mm. No intrahepatic, common hepatic,
or common bile duct dilatation

Liver: There is an inhomogeneous echotexture solid mass arising in
the anterior segment of the right lobe of the liver measuring 4.2 x
6.0 x 3.9 cm. There is an adjacent complex mass in the anterior
segment right lobe of the liver with cystic areas as well as solid
areas measuring 2.3 x 2.4 cm. Liver echogenicity overall is within
normal limits.. Portal vein is patent on color Doppler imaging with
normal direction of blood flow towards the liver.

IVC: No abnormality visualized.

Pancreas: Visualized portion unremarkable. Portions of pancreas
obscured by gas.

Spleen: Size and appearance within normal limits.

Right Kidney: Length: 10.2 cm. Echogenicity within normal limits. No
mass or hydronephrosis visualized.

Left Kidney: Length: 11.1 cm. Echogenicity within normal limits. No
hydronephrosis visualized. There is a cyst arising from of the mid
left kidney measuring 2.9 x 2.9 x 3.2 cm.

Abdominal aorta: No aneurysm visualized.

Other findings: No appreciable ascites.

There is a mass to the left of midline at the level of the umbilicus
measuring 5.8 x 3.8 x 5.1 cm. This mass is hypoechoic with increased
through transmission. There is vascular flow in this lesion
indicating that it does not represent a cyst.
IMPRESSION: 1. Mass in the left mid abdomen at the level of the umbilicus. This
mass is noncystic with vascular flow although uniformly hypoechoic
with increased through transmission. This mass could represent an
enlarged lymph node or a mass arising from the mesentery. Note that
there was a mesenteric mass in this area in 6627. This mass
currently by ultrasound measures 5.8 x 3.8 x 5.1 cm. This mass may
be equivocally larger than on previous study. It may be prudent to
consider abdominal CT, ideally with intravenous contrast, to
directly compare this current lesion with a previous lesion.

2. Liver masses are evident, inhomogeneous in echotexture. They do
not represent typical hemangiomas. Note that the mass lesions were
noted in the liver on prior study. Again, direct comparison with CT
with intravenous contrast may be helpful to assess for stability.

3. Gallbladder contracted and filled with calculi. No
pericholecystic fluid.

4. Portions of pancreas obscured by gas. Visualized portions of
pancreas appear unremarkable.

5.  Cyst arising from the mid left kidney.

6. No splenic lesions evident. Spleen within normal limits with
respect to size with normal contour.

## 2019-07-21 IMAGING — CT CT HEAD W/O CM
3 series · 14 of 47 positions shown, 16 images · non-contrast
Comparison: MR brain 06/20/2016.  CT head 01/13/2015.

CLINICAL DATA: Delirium superimposed on dementia.

EXAM:
CT HEAD WITHOUT CONTRAST
TECHNIQUE: Contiguous axial images were obtained from the base of the skull
through the vertex without intravenous contrast.

[Series 2: head wo · axial · 0.47mm/px · z∈[-138,-13]mm · 8 of 30 slices shown, 10 images]
[im 3/30  brain]
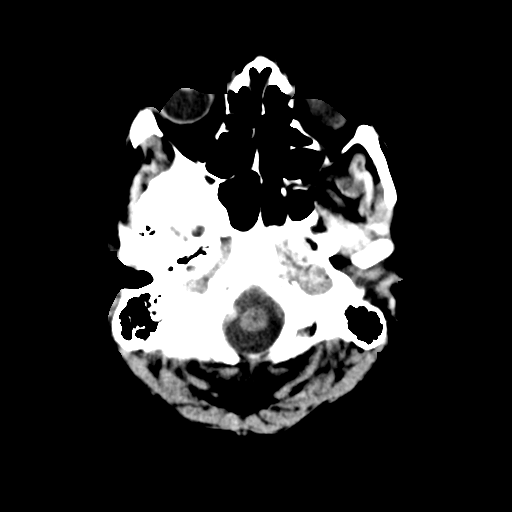
[im 3/30  bone]
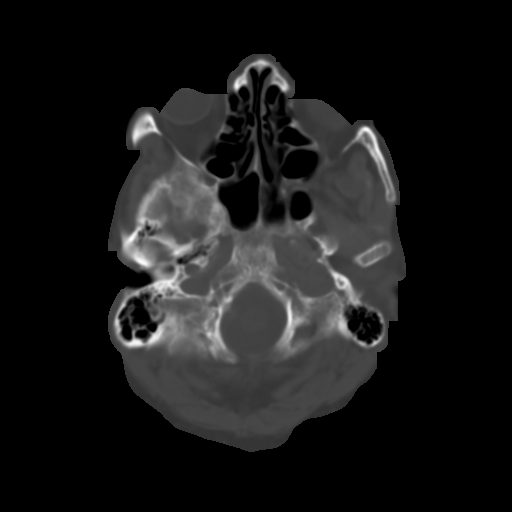
[im 7/30  brain]
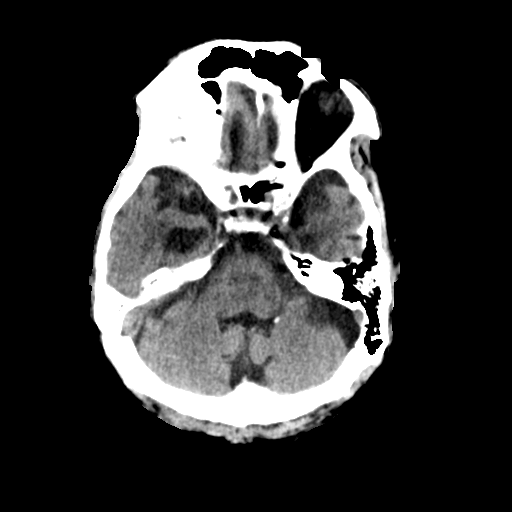
[im 10/30  brain]
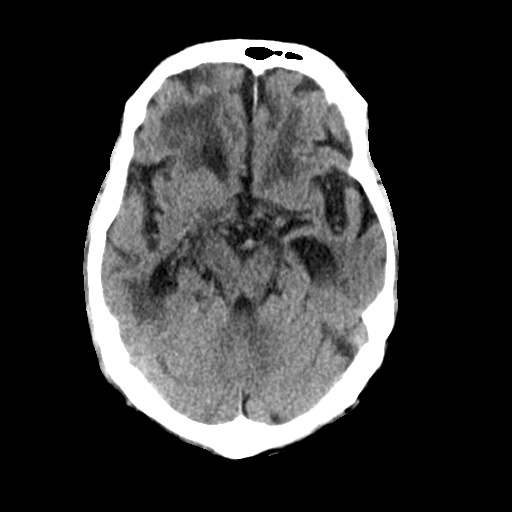
[im 14/30  brain]
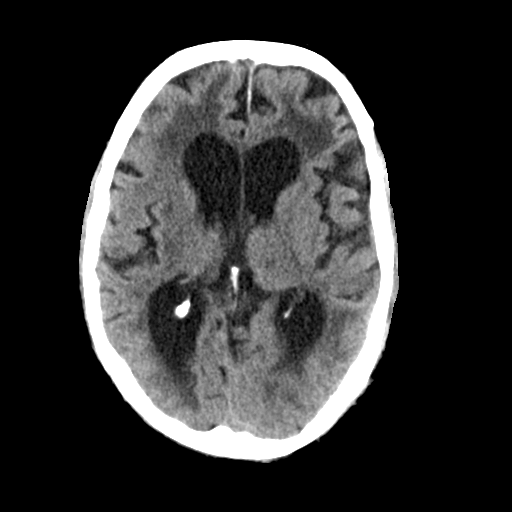
[im 17/30  brain]
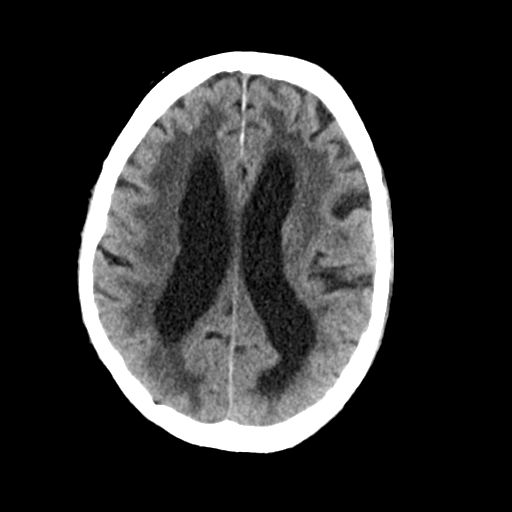
[im 17/30  bone]
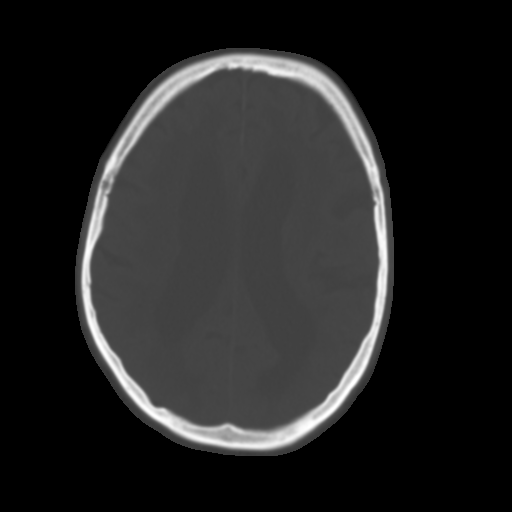
[im 21/30  brain]
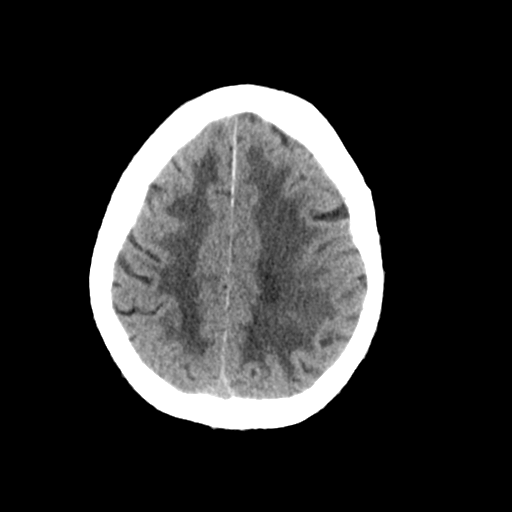
[im 24/30  brain]
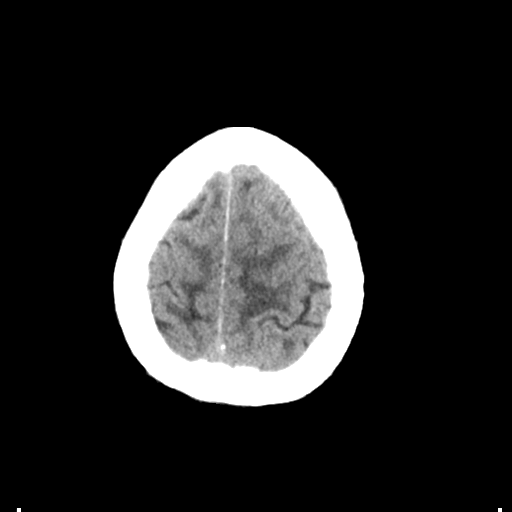
[im 28/30  brain]
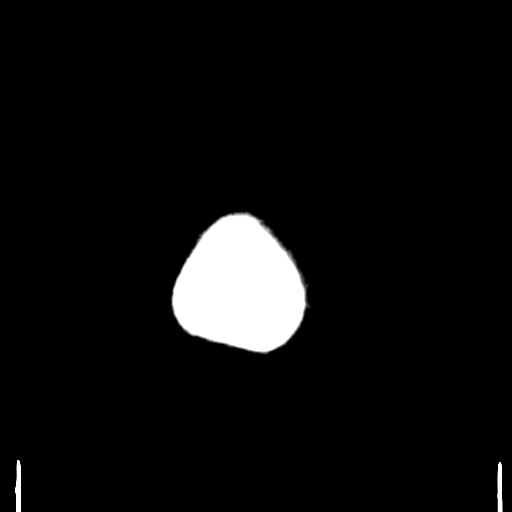

[Series 4: coronal soft tissue · coronal · 0.29mm/px · 3 of 68 slices shown]
[im 23/68  brain]
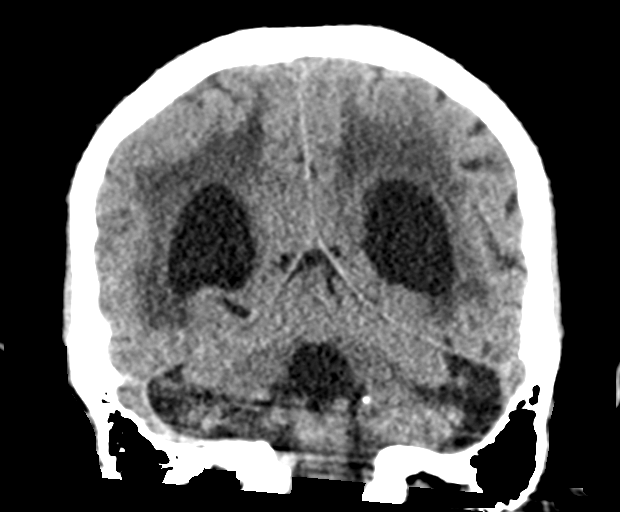
[im 30/68  brain]
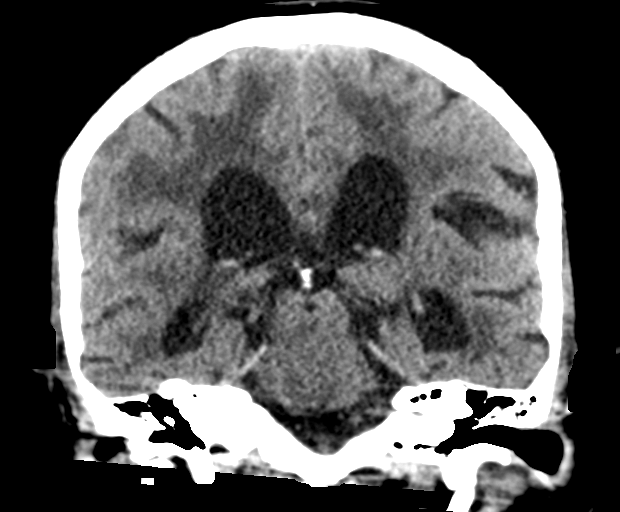
[im 38/68  brain]
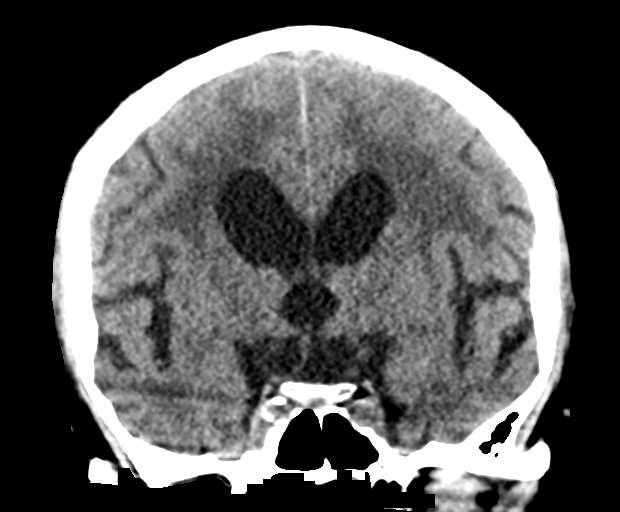

[Series 5: sagittal soft tissue · sagittal · 0.29mm/px · 3 of 54 slices shown]
[im 18/54  brain]
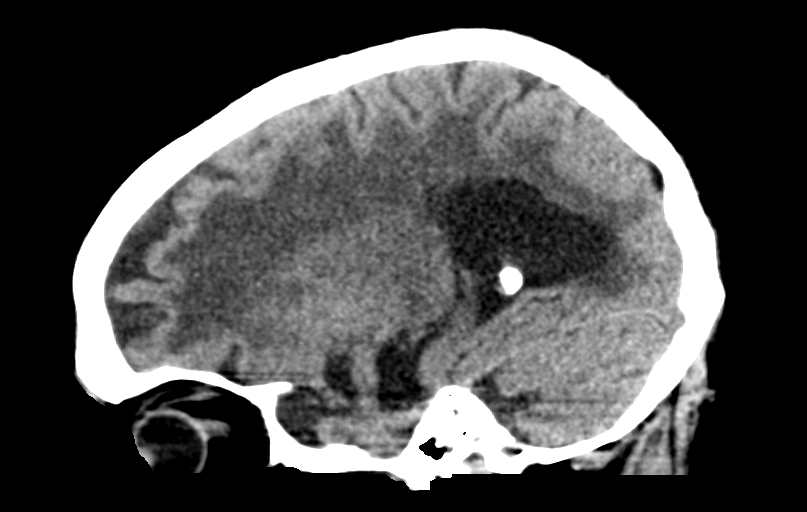
[im 27/54  brain]
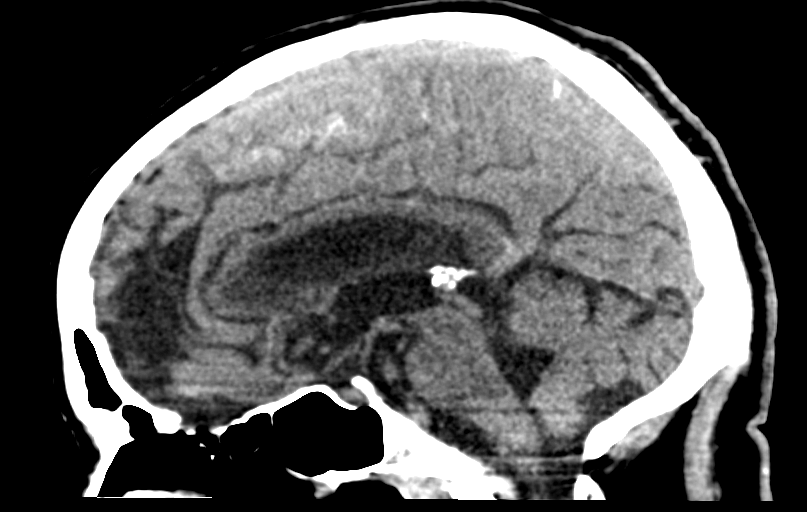
[im 36/54  brain]
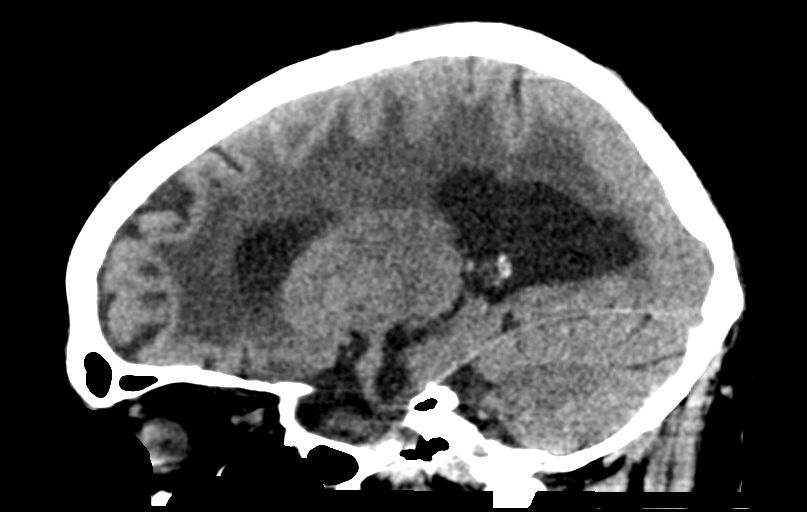

[14 of 47 positions shown; findings below may reference images not displayed]

FINDINGS: Brain: No evidence of acute stroke, acute hemorrhage, mass lesion,
or extra-axial fluid. Hydrocephalus ex vacuo. Extensive generalized
brain substance loss is noted along with extreme hypoattenuation of
white matter representing small vessel disease. Tiny LEFT parietal
white matter calcification is stable, likely old vascular or
infectious insult.

Vascular: Calcification of the cavernous internal carotid arteries
consistent with cerebrovascular atherosclerotic disease. No signs of
intracranial large vessel occlusion.

Skull: Normal. Negative for fracture or focal lesion.

Sinuses/Orbits: No acute finding.

Other: None.
IMPRESSION: Advanced atrophy and small vessel disease. No acute intracranial
findings are evident on this noncontrast CT scan. Similar appearance
to priors.
